# Patient Record
Sex: Female | Born: 1970 | Race: Black or African American | Hispanic: No | Smoking: Never smoker
Health system: Southern US, Community
[De-identification: ages and names within clinical notes are randomized; demographics above are authoritative.]

## PROBLEM LIST (undated history)

## (undated) DIAGNOSIS — K8020 Calculus of gallbladder without cholecystitis without obstruction: Secondary | ICD-10-CM

## (undated) DIAGNOSIS — D649 Anemia, unspecified: Secondary | ICD-10-CM

## (undated) HISTORY — DX: Anemia, unspecified: D64.9

## (undated) HISTORY — PX: URETHRAL DIVERTICULUM REPAIR: SHX5148

---

## 1995-01-14 DIAGNOSIS — E119 Type 2 diabetes mellitus without complications: Secondary | ICD-10-CM | POA: Insufficient documentation

## 2003-01-31 ENCOUNTER — Inpatient Hospital Stay (HOSPITAL_COMMUNITY): Admission: AD | Admit: 2003-01-31 | Discharge: 2003-01-31 | Payer: Self-pay | Admitting: *Deleted

## 2003-02-01 ENCOUNTER — Inpatient Hospital Stay (HOSPITAL_COMMUNITY): Admission: AD | Admit: 2003-02-01 | Discharge: 2003-02-01 | Payer: Self-pay | Admitting: Obstetrics and Gynecology

## 2003-02-14 ENCOUNTER — Encounter (INDEPENDENT_AMBULATORY_CARE_PROVIDER_SITE_OTHER): Payer: Self-pay | Admitting: *Deleted

## 2003-02-14 ENCOUNTER — Encounter: Admission: RE | Admit: 2003-02-14 | Discharge: 2003-02-14 | Payer: Self-pay | Admitting: Obstetrics and Gynecology

## 2003-02-27 ENCOUNTER — Emergency Department (HOSPITAL_COMMUNITY): Admission: EM | Admit: 2003-02-27 | Discharge: 2003-02-27 | Payer: Self-pay | Admitting: Family Medicine

## 2003-04-13 ENCOUNTER — Encounter: Admission: RE | Admit: 2003-04-13 | Discharge: 2003-04-13 | Payer: Self-pay | Admitting: Obstetrics and Gynecology

## 2003-04-13 ENCOUNTER — Inpatient Hospital Stay (HOSPITAL_COMMUNITY): Admission: AD | Admit: 2003-04-13 | Discharge: 2003-04-13 | Payer: Self-pay | Admitting: Obstetrics & Gynecology

## 2003-05-19 ENCOUNTER — Encounter: Admission: RE | Admit: 2003-05-19 | Discharge: 2003-05-19 | Payer: Self-pay | Admitting: Family Medicine

## 2003-08-03 ENCOUNTER — Other Ambulatory Visit: Admission: RE | Admit: 2003-08-03 | Discharge: 2003-08-03 | Payer: Self-pay | Admitting: Obstetrics and Gynecology

## 2003-08-03 ENCOUNTER — Encounter (INDEPENDENT_AMBULATORY_CARE_PROVIDER_SITE_OTHER): Payer: Self-pay | Admitting: *Deleted

## 2003-08-03 ENCOUNTER — Encounter: Admission: RE | Admit: 2003-08-03 | Discharge: 2003-08-03 | Payer: Self-pay | Admitting: Obstetrics and Gynecology

## 2003-08-18 ENCOUNTER — Encounter: Admission: RE | Admit: 2003-08-18 | Discharge: 2003-08-18 | Payer: Self-pay | Admitting: Obstetrics and Gynecology

## 2003-10-05 ENCOUNTER — Emergency Department (HOSPITAL_COMMUNITY): Admission: EM | Admit: 2003-10-05 | Discharge: 2003-10-05 | Payer: Self-pay | Admitting: *Deleted

## 2003-10-07 ENCOUNTER — Emergency Department (HOSPITAL_COMMUNITY): Admission: EM | Admit: 2003-10-07 | Discharge: 2003-10-07 | Payer: Self-pay | Admitting: Emergency Medicine

## 2003-11-13 ENCOUNTER — Emergency Department (HOSPITAL_COMMUNITY): Admission: EM | Admit: 2003-11-13 | Discharge: 2003-11-13 | Payer: Self-pay | Admitting: Family Medicine

## 2004-02-01 ENCOUNTER — Emergency Department (HOSPITAL_COMMUNITY): Admission: EM | Admit: 2004-02-01 | Discharge: 2004-02-01 | Payer: Self-pay | Admitting: Family Medicine

## 2004-03-31 ENCOUNTER — Emergency Department (HOSPITAL_COMMUNITY): Admission: EM | Admit: 2004-03-31 | Discharge: 2004-03-31 | Payer: Self-pay | Admitting: Emergency Medicine

## 2004-09-14 ENCOUNTER — Emergency Department (HOSPITAL_COMMUNITY): Admission: EM | Admit: 2004-09-14 | Discharge: 2004-09-14 | Payer: Self-pay | Admitting: *Deleted

## 2004-09-24 ENCOUNTER — Encounter: Admission: RE | Admit: 2004-09-24 | Discharge: 2004-09-24 | Payer: Self-pay | Admitting: Occupational Medicine

## 2004-10-10 ENCOUNTER — Encounter: Admission: RE | Admit: 2004-10-10 | Discharge: 2004-10-23 | Payer: Self-pay | Admitting: Occupational Medicine

## 2005-02-28 ENCOUNTER — Emergency Department (HOSPITAL_COMMUNITY): Admission: EM | Admit: 2005-02-28 | Discharge: 2005-02-28 | Payer: Self-pay | Admitting: Family Medicine

## 2005-07-01 ENCOUNTER — Emergency Department (HOSPITAL_COMMUNITY): Admission: EM | Admit: 2005-07-01 | Discharge: 2005-07-01 | Payer: Self-pay | Admitting: Family Medicine

## 2005-12-15 ENCOUNTER — Inpatient Hospital Stay (HOSPITAL_COMMUNITY): Admission: AD | Admit: 2005-12-15 | Discharge: 2005-12-15 | Payer: Self-pay | Admitting: Gynecology

## 2005-12-17 ENCOUNTER — Inpatient Hospital Stay (HOSPITAL_COMMUNITY): Admission: AD | Admit: 2005-12-17 | Discharge: 2005-12-17 | Payer: Self-pay | Admitting: Gynecology

## 2005-12-24 ENCOUNTER — Inpatient Hospital Stay (HOSPITAL_COMMUNITY): Admission: RE | Admit: 2005-12-24 | Discharge: 2005-12-24 | Payer: Self-pay | Admitting: Obstetrics & Gynecology

## 2006-07-20 ENCOUNTER — Inpatient Hospital Stay (HOSPITAL_COMMUNITY): Admission: AD | Admit: 2006-07-20 | Discharge: 2006-07-20 | Payer: Self-pay | Admitting: Obstetrics

## 2006-10-14 ENCOUNTER — Emergency Department (HOSPITAL_COMMUNITY): Admission: EM | Admit: 2006-10-14 | Discharge: 2006-10-14 | Payer: Self-pay | Admitting: Family Medicine

## 2007-03-03 ENCOUNTER — Emergency Department (HOSPITAL_COMMUNITY): Admission: EM | Admit: 2007-03-03 | Discharge: 2007-03-03 | Payer: Self-pay | Admitting: Emergency Medicine

## 2007-03-12 ENCOUNTER — Emergency Department (HOSPITAL_COMMUNITY): Admission: EM | Admit: 2007-03-12 | Discharge: 2007-03-12 | Payer: Self-pay | Admitting: Family Medicine

## 2007-05-01 ENCOUNTER — Inpatient Hospital Stay (HOSPITAL_COMMUNITY): Admission: AD | Admit: 2007-05-01 | Discharge: 2007-05-01 | Payer: Self-pay | Admitting: Obstetrics & Gynecology

## 2007-05-13 ENCOUNTER — Emergency Department (HOSPITAL_COMMUNITY): Admission: EM | Admit: 2007-05-13 | Discharge: 2007-05-13 | Payer: Self-pay | Admitting: Family Medicine

## 2007-05-13 LAB — CONVERTED CEMR LAB
BUN: 8 mg/dL
Chloride: 104 meq/L
Creatinine, Ser: 0.8 mg/dL
Glucose, Bld: 113 mg/dL
HCT: 40 %
Hemoglobin: 13.6 g/dL
Potassium: 3.8 meq/L
Sodium: 140 meq/L

## 2007-05-14 LAB — CONVERTED CEMR LAB: Pap Smear: NEGATIVE

## 2007-05-19 ENCOUNTER — Ambulatory Visit: Payer: Self-pay | Admitting: Obstetrics and Gynecology

## 2007-05-19 ENCOUNTER — Encounter: Payer: Self-pay | Admitting: Obstetrics and Gynecology

## 2007-05-20 ENCOUNTER — Ambulatory Visit (HOSPITAL_COMMUNITY): Admission: RE | Admit: 2007-05-20 | Discharge: 2007-05-20 | Payer: Self-pay | Admitting: Obstetrics and Gynecology

## 2007-06-02 ENCOUNTER — Ambulatory Visit: Payer: Self-pay | Admitting: Obstetrics & Gynecology

## 2007-07-05 ENCOUNTER — Ambulatory Visit: Payer: Self-pay | Admitting: Nurse Practitioner

## 2007-07-05 DIAGNOSIS — R635 Abnormal weight gain: Secondary | ICD-10-CM | POA: Insufficient documentation

## 2007-07-05 LAB — CONVERTED CEMR LAB
Bilirubin Urine: NEGATIVE
Blood Glucose, Fingerstick: 161
Glucose, Urine, Semiquant: NEGATIVE
Hgb A1c MFr Bld: 6.8 %
Nitrite: NEGATIVE
Protein, U semiquant: 30
Specific Gravity, Urine: 1.015
Urobilinogen, UA: 1
pH: 6.5

## 2007-07-07 ENCOUNTER — Ambulatory Visit: Payer: Self-pay | Admitting: *Deleted

## 2007-08-13 ENCOUNTER — Ambulatory Visit: Payer: Self-pay | Admitting: Nurse Practitioner

## 2007-08-13 DIAGNOSIS — R05 Cough: Secondary | ICD-10-CM

## 2007-08-13 DIAGNOSIS — R059 Cough, unspecified: Secondary | ICD-10-CM | POA: Insufficient documentation

## 2007-08-13 DIAGNOSIS — E669 Obesity, unspecified: Secondary | ICD-10-CM | POA: Insufficient documentation

## 2007-08-13 LAB — CONVERTED CEMR LAB
Bilirubin Urine: NEGATIVE
Blood Glucose, Fingerstick: 125
Blood in Urine, dipstick: NEGATIVE
Glucose, Urine, Semiquant: NEGATIVE
Ketones, urine, test strip: NEGATIVE
Nitrite: NEGATIVE
Protein, U semiquant: 30
Specific Gravity, Urine: 1.015
Urobilinogen, UA: 0.2
pH: 8

## 2007-08-14 ENCOUNTER — Encounter (INDEPENDENT_AMBULATORY_CARE_PROVIDER_SITE_OTHER): Payer: Self-pay | Admitting: Nurse Practitioner

## 2007-08-16 ENCOUNTER — Encounter (INDEPENDENT_AMBULATORY_CARE_PROVIDER_SITE_OTHER): Payer: Self-pay | Admitting: Nurse Practitioner

## 2007-08-16 LAB — CONVERTED CEMR LAB
ALT: 23 units/L (ref 0–35)
AST: 13 units/L (ref 0–37)
Albumin: 4.1 g/dL (ref 3.5–5.2)
Alkaline Phosphatase: 52 units/L (ref 39–117)
BUN: 13 mg/dL (ref 6–23)
Basophils Absolute: 0 10*3/uL (ref 0.0–0.1)
Basophils Relative: 0 % (ref 0–1)
CO2: 25 meq/L (ref 19–32)
Calcium: 9.1 mg/dL (ref 8.4–10.5)
Chloride: 104 meq/L (ref 96–112)
Cholesterol: 195 mg/dL (ref 0–200)
Creatinine, Ser: 0.66 mg/dL (ref 0.40–1.20)
Eosinophils Absolute: 0.1 10*3/uL (ref 0.0–0.7)
Eosinophils Relative: 1 % (ref 0–5)
Glucose, Bld: 126 mg/dL — ABNORMAL HIGH (ref 70–99)
HCT: 34.5 % — ABNORMAL LOW (ref 36.0–46.0)
HDL: 46 mg/dL (ref >39)
Hemoglobin: 10.5 g/dL — ABNORMAL LOW (ref 12.0–15.0)
LDL Cholesterol: 127 mg/dL — ABNORMAL HIGH (ref 0–99)
Lymphocytes Relative: 36 % (ref 12–46)
Lymphs Abs: 2 10*3/uL (ref 0.7–4.0)
MCHC: 30.4 g/dL (ref 30.0–36.0)
MCV: 80.4 fL (ref 78.0–100.0)
Microalb, Ur: 0.75 mg/dL (ref 0.00–1.89)
Monocytes Absolute: 0.2 10*3/uL (ref 0.1–1.0)
Monocytes Relative: 4 % (ref 3–12)
Neutro Abs: 3.3 10*3/uL (ref 1.7–7.7)
Neutrophils Relative %: 58 % (ref 43–77)
Platelets: 283 10*3/uL (ref 150–400)
Potassium: 4.2 meq/L (ref 3.5–5.3)
RBC: 4.29 M/uL (ref 3.87–5.11)
RDW: 16.9 % — ABNORMAL HIGH (ref 11.5–15.5)
Sodium: 138 meq/L (ref 135–145)
TSH: 0.733 microintl units/mL (ref 0.350–4.50)
Total Bilirubin: 0.7 mg/dL (ref 0.3–1.2)
Total CHOL/HDL Ratio: 4.2
Total Protein: 7.5 g/dL (ref 6.0–8.3)
Triglycerides: 108 mg/dL (ref <150)
VLDL: 22 mg/dL (ref 0–40)
WBC: 5.6 10*3/uL (ref 4.0–10.5)

## 2007-09-13 ENCOUNTER — Emergency Department (HOSPITAL_COMMUNITY): Admission: EM | Admit: 2007-09-13 | Discharge: 2007-09-13 | Payer: Self-pay | Admitting: Emergency Medicine

## 2007-11-16 ENCOUNTER — Telehealth (INDEPENDENT_AMBULATORY_CARE_PROVIDER_SITE_OTHER): Payer: Self-pay | Admitting: Nurse Practitioner

## 2007-11-17 ENCOUNTER — Ambulatory Visit: Payer: Self-pay | Admitting: Family Medicine

## 2007-11-17 LAB — CONVERTED CEMR LAB
Beta hcg, urine, semiquantitative: POSITIVE
Bilirubin Urine: NEGATIVE
Blood Glucose, Fingerstick: 138
Blood in Urine, dipstick: NEGATIVE
Glucose, Urine, Semiquant: NEGATIVE
Hgb A1c MFr Bld: 6.7 %
Ketones, urine, test strip: NEGATIVE
Nitrite: NEGATIVE
Protein, U semiquant: NEGATIVE
Specific Gravity, Urine: 1.015
Urobilinogen, UA: 1
pH: 7

## 2007-11-18 ENCOUNTER — Telehealth (INDEPENDENT_AMBULATORY_CARE_PROVIDER_SITE_OTHER): Payer: Self-pay | Admitting: Nurse Practitioner

## 2008-07-18 ENCOUNTER — Emergency Department (HOSPITAL_COMMUNITY): Admission: EM | Admit: 2008-07-18 | Discharge: 2008-07-18 | Payer: Self-pay | Admitting: Family Medicine

## 2008-07-27 ENCOUNTER — Emergency Department (HOSPITAL_COMMUNITY): Admission: EM | Admit: 2008-07-27 | Discharge: 2008-07-28 | Payer: Self-pay | Admitting: Emergency Medicine

## 2008-09-18 ENCOUNTER — Inpatient Hospital Stay (HOSPITAL_COMMUNITY): Admission: AD | Admit: 2008-09-18 | Discharge: 2008-09-19 | Payer: Self-pay | Admitting: Obstetrics

## 2008-11-27 ENCOUNTER — Emergency Department (HOSPITAL_COMMUNITY): Admission: EM | Admit: 2008-11-27 | Discharge: 2008-11-27 | Payer: Self-pay | Admitting: Emergency Medicine

## 2009-04-01 ENCOUNTER — Ambulatory Visit: Payer: Self-pay | Admitting: Obstetrics and Gynecology

## 2009-04-01 ENCOUNTER — Inpatient Hospital Stay (HOSPITAL_COMMUNITY): Admission: AD | Admit: 2009-04-01 | Discharge: 2009-04-01 | Payer: Self-pay | Admitting: Family Medicine

## 2009-04-13 ENCOUNTER — Emergency Department (HOSPITAL_COMMUNITY): Admission: EM | Admit: 2009-04-13 | Discharge: 2009-04-13 | Payer: Self-pay | Admitting: Emergency Medicine

## 2009-09-16 ENCOUNTER — Emergency Department (HOSPITAL_COMMUNITY): Admission: EM | Admit: 2009-09-16 | Discharge: 2009-09-16 | Payer: Self-pay | Admitting: Emergency Medicine

## 2010-02-02 ENCOUNTER — Encounter: Payer: Self-pay | Admitting: *Deleted

## 2010-03-28 LAB — CBC
HCT: 32.5 % — ABNORMAL LOW (ref 36.0–46.0)
Hemoglobin: 10.8 g/dL — ABNORMAL LOW (ref 12.0–15.0)
MCH: 26.6 pg (ref 26.0–34.0)
MCHC: 33.3 g/dL (ref 30.0–36.0)
MCV: 79.9 fL (ref 78.0–100.0)
Platelets: 223 10*3/uL (ref 150–400)
RBC: 4.07 MIL/uL (ref 3.87–5.11)
RDW: 15.9 % — ABNORMAL HIGH (ref 11.5–15.5)
WBC: 8.4 10*3/uL (ref 4.0–10.5)

## 2010-03-28 LAB — DIFFERENTIAL
Basophils Absolute: 0 10*3/uL (ref 0.0–0.1)
Basophils Relative: 0 % (ref 0–1)
Eosinophils Absolute: 0.1 10*3/uL (ref 0.0–0.7)
Eosinophils Relative: 1 % (ref 0–5)
Lymphocytes Relative: 39 % (ref 12–46)
Lymphs Abs: 3.3 10*3/uL (ref 0.7–4.0)
Monocytes Absolute: 0.4 10*3/uL (ref 0.1–1.0)
Monocytes Relative: 5 % (ref 3–12)
Neutro Abs: 4.6 10*3/uL (ref 1.7–7.7)
Neutrophils Relative %: 54 % (ref 43–77)

## 2010-03-28 LAB — URINALYSIS, ROUTINE W REFLEX MICROSCOPIC
Bilirubin Urine: NEGATIVE
Hgb urine dipstick: NEGATIVE
Ketones, ur: NEGATIVE mg/dL
Nitrite: NEGATIVE
Protein, ur: NEGATIVE mg/dL
Specific Gravity, Urine: 1.01 (ref 1.005–1.030)
Urobilinogen, UA: 1 mg/dL (ref 0.0–1.0)

## 2010-03-28 LAB — COMPREHENSIVE METABOLIC PANEL
ALT: 24 U/L (ref 0–35)
AST: 19 U/L (ref 0–37)
Albumin: 3.6 g/dL (ref 3.5–5.2)
Alkaline Phosphatase: 43 U/L (ref 39–117)
BUN: 12 mg/dL (ref 6–23)
CO2: 28 mEq/L (ref 19–32)
Calcium: 8.9 mg/dL (ref 8.4–10.5)
Chloride: 106 mEq/L (ref 96–112)
Creatinine, Ser: 0.69 mg/dL (ref 0.4–1.2)
GFR calc Af Amer: >60 mL/min (ref >60)
GFR calc non Af Amer: >60 mL/min (ref >60)
Glucose, Bld: 106 mg/dL — ABNORMAL HIGH (ref 70–99)
Potassium: 3.5 mEq/L (ref 3.5–5.1)
Sodium: 137 mEq/L (ref 135–145)
Total Bilirubin: 0.5 mg/dL (ref 0.3–1.2)
Total Protein: 7.4 g/dL (ref 6.0–8.3)

## 2010-03-28 LAB — URINE CULTURE: Culture  Setup Time: 201109042355

## 2010-03-28 LAB — URINE MICROSCOPIC-ADD ON

## 2010-03-28 LAB — POCT PREGNANCY, URINE: Preg Test, Ur: NEGATIVE

## 2010-04-03 LAB — BASIC METABOLIC PANEL
BUN: 7 mg/dL (ref 6–23)
CO2: 27 mEq/L (ref 19–32)
Calcium: 9.3 mg/dL (ref 8.4–10.5)
Glucose, Bld: 107 mg/dL — ABNORMAL HIGH (ref 70–99)
Sodium: 138 mEq/L (ref 135–145)

## 2010-04-03 LAB — CBC
HCT: 35.4 % — ABNORMAL LOW (ref 36.0–46.0)
Hemoglobin: 11.4 g/dL — ABNORMAL LOW (ref 12.0–15.0)
MCHC: 32.2 g/dL (ref 30.0–36.0)
Platelets: 262 10*3/uL (ref 150–400)
RDW: 15.2 % (ref 11.5–15.5)

## 2010-04-03 LAB — URINE MICROSCOPIC-ADD ON

## 2010-04-03 LAB — URINALYSIS, ROUTINE W REFLEX MICROSCOPIC
Bilirubin Urine: NEGATIVE
Glucose, UA: NEGATIVE mg/dL
Ketones, ur: NEGATIVE mg/dL
pH: 6.5 (ref 5.0–8.0)

## 2010-04-03 LAB — DIFFERENTIAL
Eosinophils Absolute: 0.1 10*3/uL (ref 0.0–0.7)
Eosinophils Relative: 1 % (ref 0–5)
Lymphocytes Relative: 46 % (ref 12–46)
Lymphs Abs: 2.8 10*3/uL (ref 0.7–4.0)
Monocytes Absolute: 0.3 10*3/uL (ref 0.1–1.0)

## 2010-04-03 LAB — CK TOTAL AND CKMB (NOT AT ARMC): Total CK: 93 U/L (ref 7–177)

## 2010-04-03 LAB — POCT PREGNANCY, URINE: Preg Test, Ur: NEGATIVE

## 2010-04-03 LAB — URINE CULTURE

## 2010-04-07 LAB — URINALYSIS, ROUTINE W REFLEX MICROSCOPIC
Bilirubin Urine: NEGATIVE
Glucose, UA: NEGATIVE mg/dL
Ketones, ur: NEGATIVE mg/dL
pH: 6 (ref 5.0–8.0)

## 2010-04-07 LAB — URINE CULTURE

## 2010-04-07 LAB — URINE MICROSCOPIC-ADD ON

## 2010-04-17 LAB — DIFFERENTIAL
Eosinophils Absolute: 0.1 10*3/uL (ref 0.0–0.7)
Lymphocytes Relative: 33 % (ref 12–46)
Lymphs Abs: 2.6 10*3/uL (ref 0.7–4.0)
Neutrophils Relative %: 60 % (ref 43–77)

## 2010-04-17 LAB — URINALYSIS, ROUTINE W REFLEX MICROSCOPIC
Ketones, ur: NEGATIVE mg/dL
Nitrite: NEGATIVE
Protein, ur: NEGATIVE mg/dL
pH: 6.5 (ref 5.0–8.0)

## 2010-04-17 LAB — POCT I-STAT, CHEM 8
BUN: 16 mg/dL (ref 6–23)
Chloride: 98 mEq/L (ref 96–112)
HCT: 40 % (ref 36.0–46.0)
Sodium: 136 mEq/L (ref 135–145)
TCO2: 27 mmol/L (ref 0–100)

## 2010-04-17 LAB — CBC
Hemoglobin: 12.1 g/dL (ref 12.0–15.0)
MCHC: 33.5 g/dL (ref 30.0–36.0)
MCV: 80.3 fL (ref 78.0–100.0)
RBC: 4.48 MIL/uL (ref 3.87–5.11)

## 2010-04-17 LAB — URINE CULTURE

## 2010-04-17 LAB — GLUCOSE, CAPILLARY: Glucose-Capillary: 162 mg/dL — ABNORMAL HIGH (ref 70–99)

## 2010-04-17 LAB — COMPREHENSIVE METABOLIC PANEL
BUN: 16 mg/dL (ref 6–23)
CO2: 27 mEq/L (ref 19–32)
Calcium: 8.9 mg/dL (ref 8.4–10.5)
Creatinine, Ser: 0.79 mg/dL (ref 0.4–1.2)
GFR calc non Af Amer: >60 mL/min (ref >60)
Glucose, Bld: 175 mg/dL — ABNORMAL HIGH (ref 70–99)

## 2010-04-17 LAB — LIPASE, BLOOD: Lipase: 47 U/L (ref 11–59)

## 2010-04-17 LAB — URINE MICROSCOPIC-ADD ON

## 2010-04-17 IMAGING — US US TRANSVAGINAL NON-OB
1 series · 13 of 25 positions shown · non-contrast
Comparison: None

CLINICAL DATA: Abnormal periods.  LMP 05/13/2007

TRANSABDOMINAL AND TRANSVAGINAL ULTRASOUND OF PELVIS
TECHNIQUE: Both transabdominal and transvaginal ultrasound
examinations of the pelvis were performed including evaluation of
the uterus, ovaries, adnexal regions, and pelvic cul-de-sac.

[Series 1: us transvaginal non-ob · 13 of 41 slices shown]
[im 1/41]
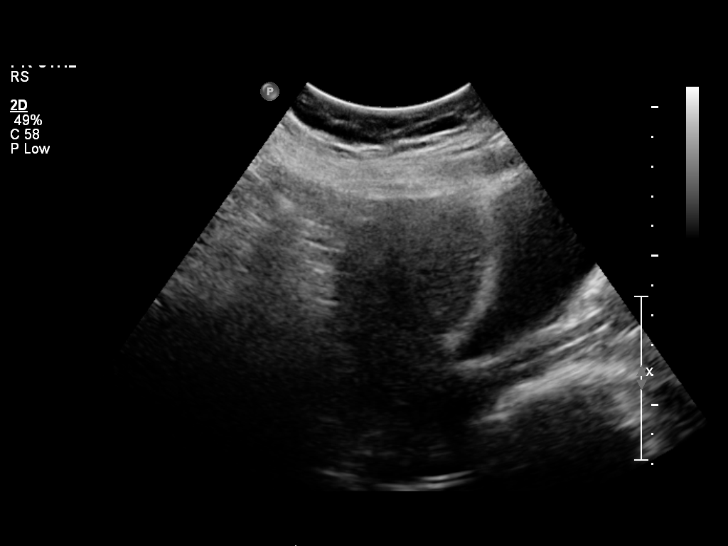
[im 4/41]
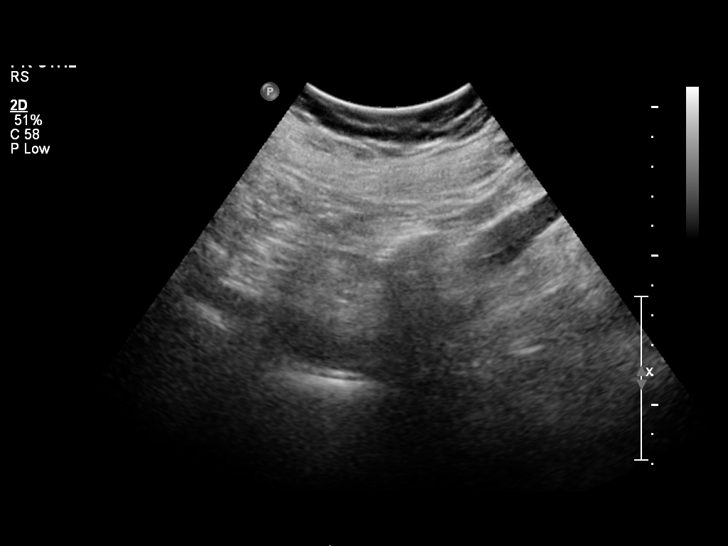
[im 7/41]
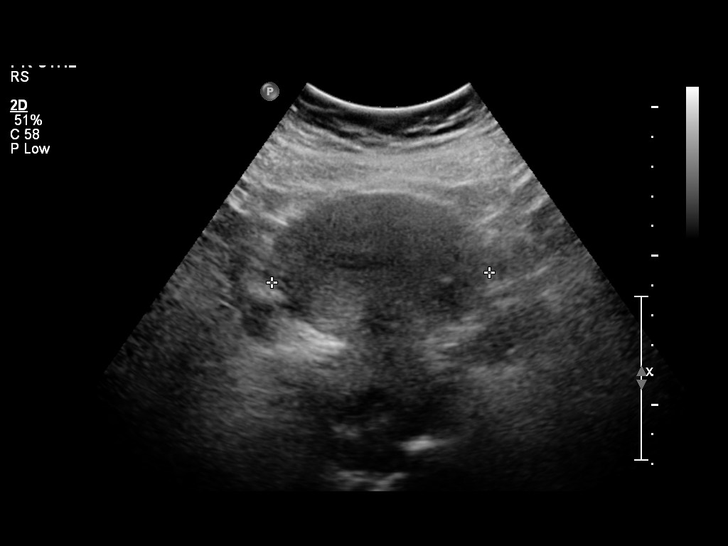
[im 11/41]
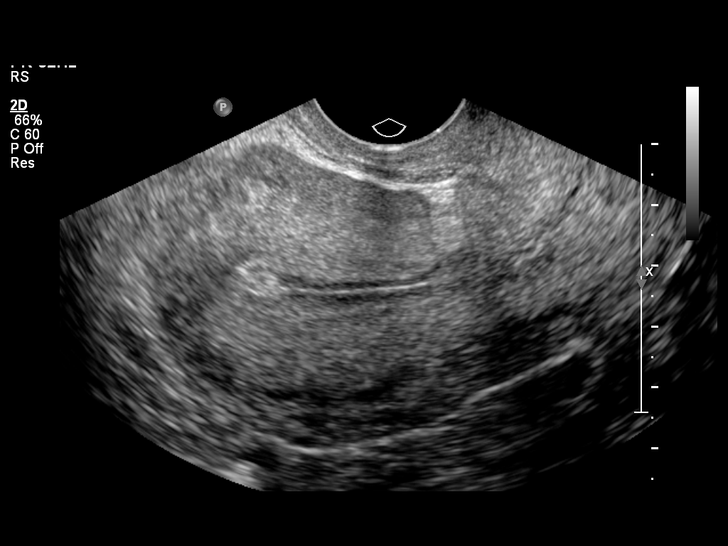
[im 14/41]
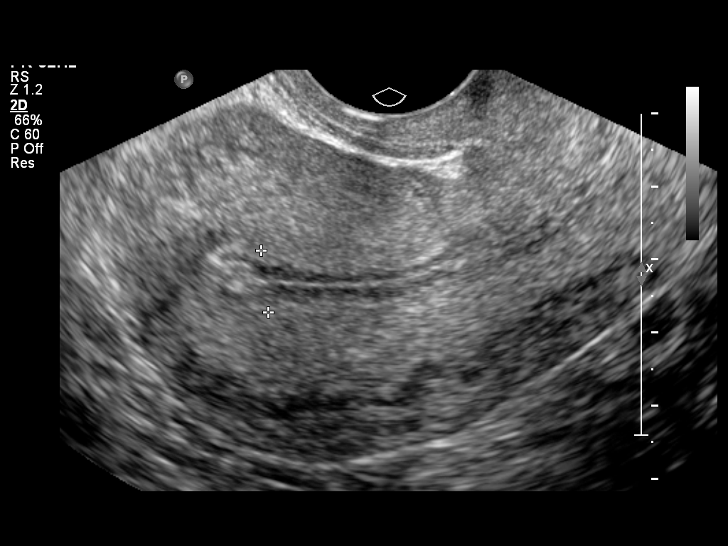
[im 17/41]
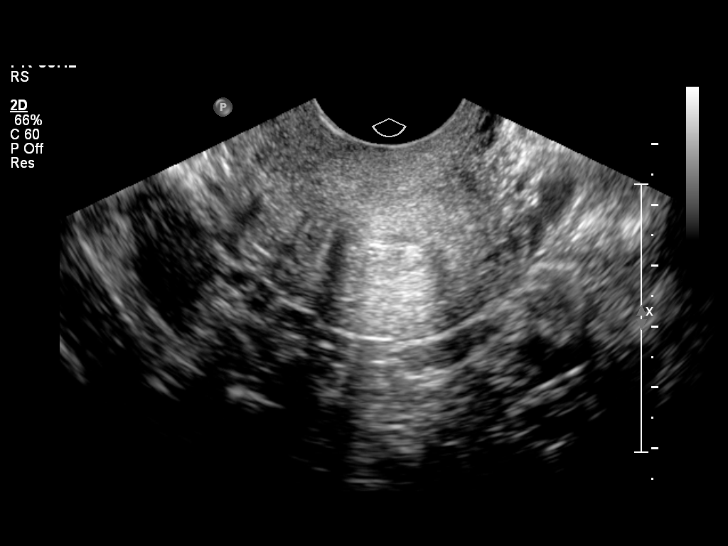
[im 21/41]
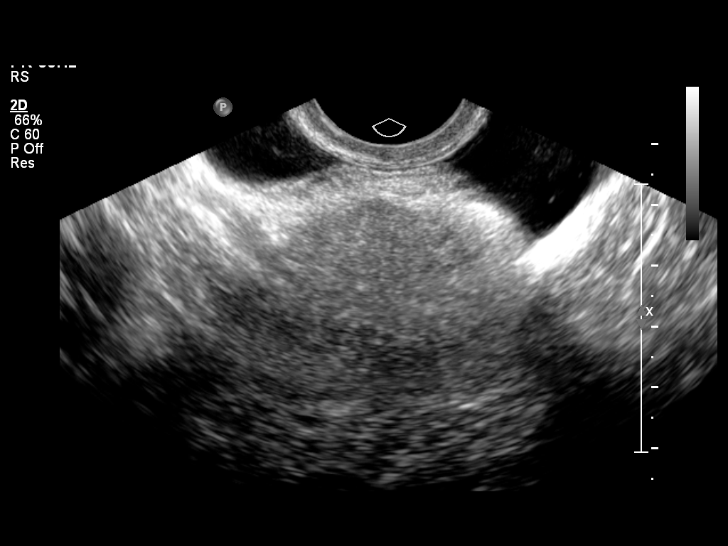
[im 24/41]
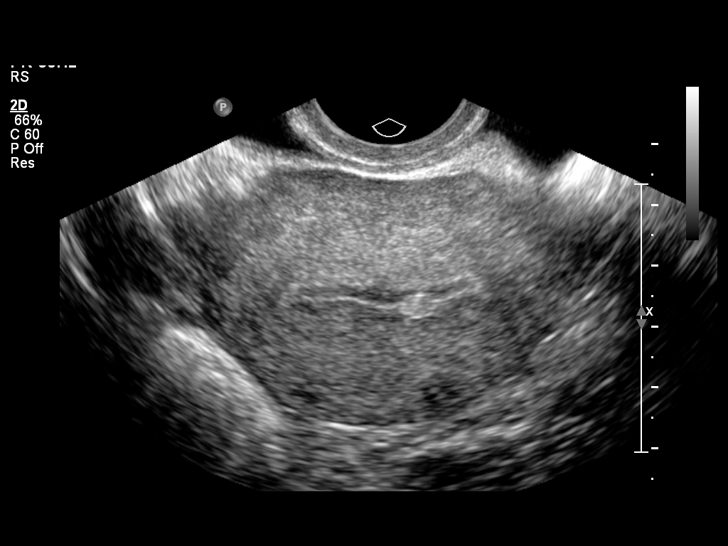
[im 27/41]
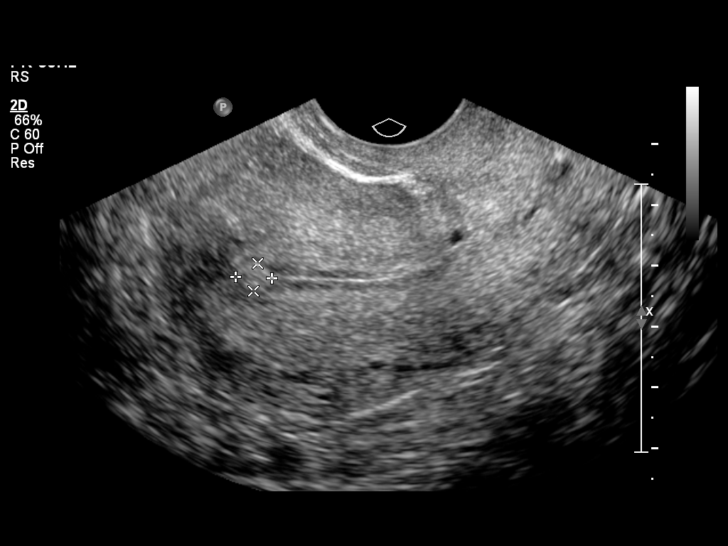
[im 31/41]
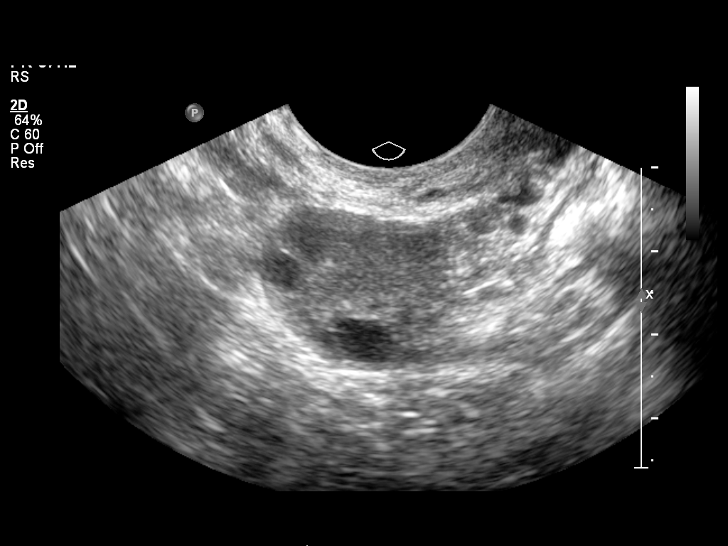
[im 34/41]
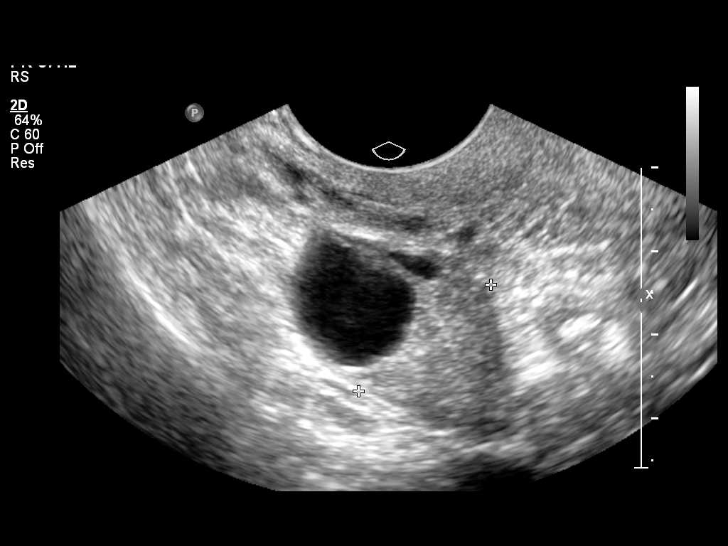
[im 37/41]
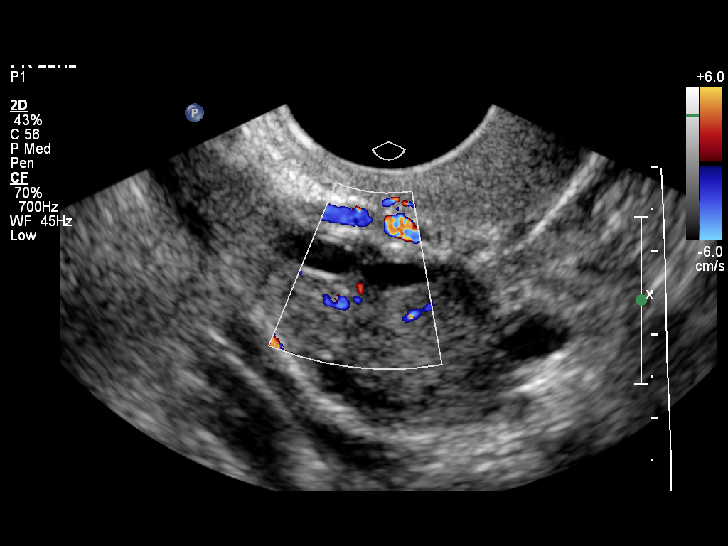
[im 41/41]
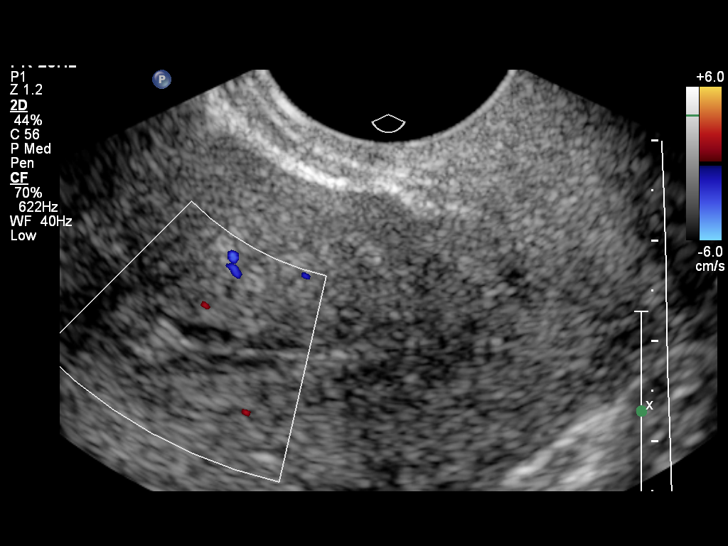

[13 of 25 positions shown; findings below may reference images not displayed]

FINDINGS: The uterus demonstrates a sagittal length of 8.5 cm, an
AP width of 4.6 cm and a transverse width of 5.9 cm.  A homogeneous
uterine myometrium is seen.  The endometrial canal demonstrates an
AP width of 8.5 mm and a tri- layered appearance compatible with a
periovulatory endometrial stripe.  This would correlate with the
patient's given LMP of 05/13/2007.  There is an area of focal
echogenicity identified in the fundal portion of the endometrial
canal which measures 6.8 x 5.0 by 6.4 mm.  No focal flow is seen to
this location with color Doppler evaluation , however the
appearance is suggestive of a small focal polyp.  Further
evaluation with sonohysterography would be recommended for
confirmation.

Both ovaries have a normal appearance with the left ovary measuring
2.3 x 1.8 by 1.8 cm and the right ovary measuring 2.0 x 3.5 x
cm. A dominant follicle is identified on the right.

No pelvic fluid is seen.
IMPRESSION: Small focal area of echogenicity within the fundal portion of the
endometrial canal worrisome for a focal polyp.  Further evaluation
with sonohysterography is recommended for complete evaluation.
Otherwise normal periovulatory pelvic ultrasound.

## 2010-04-19 LAB — WET PREP, GENITAL: Trich, Wet Prep: NONE SEEN

## 2010-04-19 LAB — URINALYSIS, ROUTINE W REFLEX MICROSCOPIC
Ketones, ur: NEGATIVE mg/dL
Protein, ur: NEGATIVE mg/dL
Urobilinogen, UA: 0.2 mg/dL (ref 0.0–1.0)

## 2010-04-19 LAB — URINE MICROSCOPIC-ADD ON

## 2010-04-19 LAB — POCT PREGNANCY, URINE: Preg Test, Ur: NEGATIVE

## 2010-04-21 LAB — POCT PREGNANCY, URINE: Preg Test, Ur: POSITIVE

## 2010-05-28 NOTE — Group Therapy Note (Signed)
NAMEMORRIS, MARKHAM NO.:  [id number]   MEDICAL RECORD NO.:  [id number]          PATIENT TYPE:  WOC   LOCATION:  [facility]                   FACILITY:  [facility]   PHYSICIAN:  Elsie Lincoln, MD      DATE OF BIRTH:  12-20-1970   DATE OF SERVICE:  06/02/2007                                  CLINIC NOTE   Patient is a 40 year old female who presents for results from her labs  and ultrasound.  Patient had some irregular spotting in April, and had  tender breasts, and darkening of the linea nigra.  She was also worried  that she is pregnant.  However, she does use condoms for contraception.  On May 7, her beta HCT was negative.  Her prolactin was within normal  limits.  Her transvaginal ultrasound showed a normal uterus and ovaries  except for an area of focal echogenicity  in the fundal portion, which  is 6.8 x 5.0 x 6.4.  May be a small focal polyp.  Tried to explain this  to the patient and she refused surgery at this time, but said the  bleeding is not mostly the issue.  She is mostly worried about being  tired and her breasts hurting.  We talked about evening primrose oil  for the breasts.  We are going to check a TSH and a CBC because she does  have a history of anemia.  I suggested she go to her primary care  physician to make sure her diabetes is well controlled.  I will call her  with the results of the CBC and the TSH.  The patient can come back  p.r.n.  If the patient skips her period, the patient is supposed to get  a pregnancy test.  Also of note, her Pap smear was normal in May.           ______________________________  Elsie Lincoln, MD     KL/MEDQ  D:  06/02/2007  T:  06/02/2007  Job:  [id number]

## 2010-05-28 NOTE — Group Therapy Note (Signed)
NAME:  Belinda Stewart, Belinda Stewart NO.:  [id number]   MEDICAL RECORD NO.:  [id number]          PATIENT TYPE:  WOC   LOCATION:  [facility]                   FACILITY:  [facility]   PHYSICIAN:  Argentina Donovan, MD        DATE OF BIRTH:  1970-08-17   DATE OF SERVICE:  05/19/2007                                  CLINIC NOTE   HISTORY OF PRESENT ILLNESS:  The patient is a 40 year old African  American female gravida 4, para 3-0-1-3 who had a miscarriage late last  year.  She had normal periods up until March of this year.  She then had  a period April 2 that lasted only 2 days, one April 30 that lasted 2  days, then had 2 days of post period menstrual spotting.  For the past  month and a half, she has had very tender breasts, darkening of her  nipples, darkening linea nigra and no lactorrhea on breast compression.  She went into the maternity admissions office a short-time ago,  pregnancy test which was negative.  She said she had one at home that  she did that had the partial line.  She feels pregnant.  She has the  headaches.  She says all this goes with the pregnancy as well as a  darkening line on her abdomen and her nipples and the tender breasts.  She had a history of an abnormal Pap smear, but has not had a Pap again  in several years.  We plan on doing that today.   PHYSICAL EXAMINATION:  BREASTS:  Symmetrical with no dominant masses.  Slight nipple discharge on compression.  ABDOMEN:  Soft, flat, nontender.  No masses or organomegaly, but a  marked pronounced linea nigra.  External genitalia is normal.  BUS  within normal limits.  Vagina is clean and well rugated.  Cervix is  clean and parous and Pap smear was taken.  Uterus with first degree  retroversion, does not appear to be enlarged.  The adnexa could not be  well outlined because of the habitus of the patient, 422 pounds.   IMPRESSION:  Abnormal periods and symptoms of pregnancy per the patient.  She says she even has some  nausea.   PLAN:  Quantitative data, prolactin and a pelvic ultrasound.  Rule out  any other hormonal abnormality that may be due to ovarian cyst, etc.           ______________________________  Argentina Donovan, MD     PR/MEDQ  D:  05/19/2007  T:  05/19/2007  Job:  [id number]

## 2010-05-31 NOTE — Group Therapy Note (Signed)
Belinda Stewart, Belinda Stewart                          ACCOUNT NO.:  [id number]   MEDICAL RECORD NO.:  [id number]                   PATIENT TYPE:  OUT   LOCATION:  [facility]                           FACILITY:  [facility]   PHYSICIAN:  Argentina Donovan, MD                     DATE OF BIRTH:  08/27/1970   DATE OF SERVICE:  02/14/2003                                    CLINIC NOTE   REASON FOR VISIT:  The patient is a 40 year old gravida 3 para 3-0-0-3 who  had her first baby vaginal delivery, the second one was a C-section because  of placenta previa, and the third weighed 12 pounds and was a C-section.  She is in good health with the exception of type 2 diabetes for which she  takes 10 mg of Avandia each night and that has been going on since her last  pregnancy.  She went into the [facility]U because of low abdominal pain 2 weeks ago  and was treated for an early pyelonephritis.  Those symptoms have gone but  in discussing she has had long-time dysmenorrhea.  Over the past year or two  it has gotten increasingly worse with increasingly heavy periods and  terrible pelvic pressure which is difficult as she is on her feet all day at  work.  The patient is 5 feet 9 inches, weighs 220 pounds, has a blood  pressure of 126/82 and pulse of 87. HEENT within normal limits.  The vagina  is clean and well rugated.  The external genitalia has two herpes lesions on  the left labia.  The patient gets about two episodes a year.  The cervix is  high in the vagina, parous, and clean.  The uterus is upper limits of normal  size, shape, and consistency is normal, and adnexa could not be well  palpated because of habitus of the patient.   IMPRESSION:  1. Dysmenorrhea, menorrhagia, probable adenomyosis.  Plan:  Pelvic sonogram,     CBC.  If the sonogram is okay we have talked to the patient.  She is not     anxious to get a hysterectomy so we will try an endometrial ablation as     our method of first trial.  I will give the  patient a prescription for     Valtrex.  2. Type 2 diabetes, on treatment.  3. Recurrent herpes simplex.                                               Argentina Donovan, MD    PR/MEDQ  D:  02/14/2003  T:  02/14/2003  Job:  [id number]

## 2010-07-26 ENCOUNTER — Emergency Department (HOSPITAL_COMMUNITY)
Admission: EM | Admit: 2010-07-26 | Discharge: 2010-07-27 | Discharge status: Against Medical Advice | Payer: Self-pay | Attending: Emergency Medicine | Admitting: Emergency Medicine

## 2010-07-26 ENCOUNTER — Inpatient Hospital Stay (INDEPENDENT_AMBULATORY_CARE_PROVIDER_SITE_OTHER)
Admission: RE | Admit: 2010-07-26 | Discharge: 2010-07-26 | Disposition: A | Discharge status: Home / Self Care | Payer: Self-pay | Source: Ambulatory Visit | Attending: Emergency Medicine | Admitting: Emergency Medicine

## 2010-07-26 DIAGNOSIS — K8020 Calculus of gallbladder without cholecystitis without obstruction: Secondary | ICD-10-CM | POA: Insufficient documentation

## 2010-07-26 DIAGNOSIS — Z79899 Other long term (current) drug therapy: Secondary | ICD-10-CM | POA: Insufficient documentation

## 2010-07-26 DIAGNOSIS — R1011 Right upper quadrant pain: Secondary | ICD-10-CM | POA: Insufficient documentation

## 2010-07-26 DIAGNOSIS — R10811 Right upper quadrant abdominal tenderness: Secondary | ICD-10-CM | POA: Insufficient documentation

## 2010-07-26 DIAGNOSIS — E119 Type 2 diabetes mellitus without complications: Secondary | ICD-10-CM | POA: Insufficient documentation

## 2010-07-26 DIAGNOSIS — R112 Nausea with vomiting, unspecified: Secondary | ICD-10-CM | POA: Insufficient documentation

## 2010-07-26 DIAGNOSIS — Z9889 Other specified postprocedural states: Secondary | ICD-10-CM | POA: Insufficient documentation

## 2010-07-26 LAB — DIFFERENTIAL
Basophils Absolute: 0 10*3/uL (ref 0.0–0.1)
Eosinophils Absolute: 0.1 10*3/uL (ref 0.0–0.7)
Eosinophils Relative: 1 % (ref 0–5)
Lymphocytes Relative: 44 % (ref 12–46)
Lymphs Abs: 4 10*3/uL (ref 0.7–4.0)
Neutrophils Relative %: 51 % (ref 43–77)

## 2010-07-26 LAB — POCT I-STAT, CHEM 8
BUN: 14 mg/dL (ref 6–23)
Calcium, Ion: 1.25 mmol/L (ref 1.12–1.32)
Chloride: 103 mEq/L (ref 96–112)
Creatinine, Ser: 0.7 mg/dL (ref 0.50–1.10)
Glucose, Bld: 105 mg/dL — ABNORMAL HIGH (ref 70–99)
TCO2: 25 mmol/L (ref 0–100)

## 2010-07-26 LAB — COMPREHENSIVE METABOLIC PANEL
BUN: 14 mg/dL (ref 6–23)
CO2: 26 mEq/L (ref 19–32)
Calcium: 9.7 mg/dL (ref 8.4–10.5)
Creatinine, Ser: 0.57 mg/dL (ref 0.50–1.10)
GFR calc Af Amer: >60 mL/min (ref >60)
GFR calc non Af Amer: >60 mL/min (ref >60)
Glucose, Bld: 103 mg/dL — ABNORMAL HIGH (ref 70–99)
Total Protein: 7.8 g/dL (ref 6.0–8.3)

## 2010-07-26 LAB — CBC
HCT: 34.3 % — ABNORMAL LOW (ref 36.0–46.0)
MCV: 77.6 fL — ABNORMAL LOW (ref 78.0–100.0)
Platelets: 243 10*3/uL (ref 150–400)
RBC: 4.42 MIL/uL (ref 3.87–5.11)
RDW: 15.3 % (ref 11.5–15.5)
WBC: 9.1 10*3/uL (ref 4.0–10.5)

## 2010-07-26 LAB — POCT URINALYSIS DIP (DEVICE)
Protein, ur: NEGATIVE mg/dL
Specific Gravity, Urine: 1.015 (ref 1.005–1.030)
Urobilinogen, UA: 0.2 mg/dL (ref 0.0–1.0)
pH: 5.5 (ref 5.0–8.0)

## 2010-07-26 LAB — POCT PREGNANCY, URINE: Preg Test, Ur: NEGATIVE

## 2010-07-27 ENCOUNTER — Emergency Department (HOSPITAL_COMMUNITY): Payer: Self-pay

## 2010-07-27 LAB — URINE CULTURE
Colony Count: 55000
Culture  Setup Time: 201207132038

## 2010-10-04 LAB — DIFFERENTIAL
Basophils Absolute: 0
Eosinophils Absolute: 0.1
Lymphs Abs: 2.7
Monocytes Absolute: 0.4
Neutrophils Relative %: 56

## 2010-10-04 LAB — I-STAT 8, (EC8 V) (CONVERTED LAB)
BUN: 9
Bicarbonate: 27.3 — ABNORMAL HIGH
Glucose, Bld: 138 — ABNORMAL HIGH
Hemoglobin: 12.2
TCO2: 29
pCO2, Ven: 40.5 — ABNORMAL LOW
pH, Ven: 7.437 — ABNORMAL HIGH

## 2010-10-04 LAB — POCT URINALYSIS DIP (DEVICE)
Bilirubin Urine: NEGATIVE
Glucose, UA: NEGATIVE
Nitrite: NEGATIVE
Operator id: 116391
Urobilinogen, UA: 0.2

## 2010-10-04 LAB — CBC
HCT: 31.2 — ABNORMAL LOW
MCV: 73.2 — ABNORMAL LOW
Platelets: 298
RDW: 17.8 — ABNORMAL HIGH
WBC: 7.1

## 2010-10-04 LAB — POCT PREGNANCY, URINE
Operator id: 116391
Preg Test, Ur: NEGATIVE
Preg Test, Ur: NEGATIVE

## 2010-10-04 LAB — POCT I-STAT CREATININE: Creatinine, Ser: 0.8

## 2010-10-08 LAB — WET PREP, GENITAL
Clue Cells Wet Prep HPF POC: NONE SEEN
Trich, Wet Prep: NONE SEEN

## 2010-10-08 LAB — POCT URINALYSIS DIP (DEVICE)
Nitrite: POSITIVE — AB
Operator id: 247071
pH: 5.5

## 2010-10-08 LAB — URINALYSIS, ROUTINE W REFLEX MICROSCOPIC
Hgb urine dipstick: NEGATIVE
Nitrite: NEGATIVE
Protein, ur: NEGATIVE
Urobilinogen, UA: 0.2

## 2010-10-08 LAB — POCT I-STAT, CHEM 8
BUN: 8
Creatinine, Ser: 0.8
Glucose, Bld: 113 — ABNORMAL HIGH
Potassium: 3.8
Sodium: 140
TCO2: 25

## 2010-10-08 LAB — GC/CHLAMYDIA PROBE AMP, GENITAL: GC Probe Amp, Genital: NEGATIVE

## 2010-10-08 LAB — CBC
Hemoglobin: 11 — ABNORMAL LOW
MCHC: 33.2
RBC: 4.41
WBC: 9.6

## 2010-10-08 LAB — POCT PREGNANCY, URINE
Operator id: 247071
Preg Test, Ur: NEGATIVE
Preg Test, Ur: NEGATIVE

## 2010-10-08 LAB — URINE CULTURE

## 2010-10-08 LAB — HCG, SERUM, QUALITATIVE: Preg, Serum: NEGATIVE

## 2010-10-24 LAB — POCT PREGNANCY, URINE: Operator id: 247071

## 2010-10-24 LAB — POCT URINALYSIS DIP (DEVICE)
Bilirubin Urine: NEGATIVE
Glucose, UA: NEGATIVE
Specific Gravity, Urine: 1.02

## 2010-10-29 LAB — URINALYSIS, ROUTINE W REFLEX MICROSCOPIC
Nitrite: NEGATIVE
Specific Gravity, Urine: <1.005 — ABNORMAL LOW
pH: 6.5

## 2010-10-29 LAB — CBC
Platelets: 281
WBC: 7.1

## 2010-10-29 LAB — POCT PREGNANCY, URINE
Operator id: 22333
Preg Test, Ur: NEGATIVE

## 2010-10-29 LAB — WET PREP, GENITAL
Trich, Wet Prep: NONE SEEN
Yeast Wet Prep HPF POC: NONE SEEN

## 2010-10-29 LAB — GC/CHLAMYDIA PROBE AMP, GENITAL: GC Probe Amp, Genital: NEGATIVE

## 2010-12-21 ENCOUNTER — Emergency Department (HOSPITAL_COMMUNITY)
Admission: EM | Admit: 2010-12-21 | Discharge: 2010-12-21 | Discharge status: Against Medical Advice | Payer: Self-pay | Attending: Emergency Medicine | Admitting: Emergency Medicine

## 2010-12-21 ENCOUNTER — Other Ambulatory Visit: Payer: Self-pay

## 2010-12-21 ENCOUNTER — Emergency Department (HOSPITAL_COMMUNITY): Payer: Self-pay

## 2010-12-21 ENCOUNTER — Encounter: Payer: Self-pay | Admitting: *Deleted

## 2010-12-21 DIAGNOSIS — M549 Dorsalgia, unspecified: Secondary | ICD-10-CM | POA: Insufficient documentation

## 2010-12-21 DIAGNOSIS — R079 Chest pain, unspecified: Secondary | ICD-10-CM | POA: Insufficient documentation

## 2010-12-21 DIAGNOSIS — R109 Unspecified abdominal pain: Secondary | ICD-10-CM | POA: Insufficient documentation

## 2010-12-21 LAB — DIFFERENTIAL
Eosinophils Absolute: 0.1 10*3/uL (ref 0.0–0.7)
Eosinophils Relative: 3 % (ref 0–5)
Lymphocytes Relative: 38 % (ref 12–46)
Lymphs Abs: 2 10*3/uL (ref 0.7–4.0)
Monocytes Absolute: 0.4 10*3/uL (ref 0.1–1.0)

## 2010-12-21 LAB — CBC
MCH: 25.1 pg — ABNORMAL LOW (ref 26.0–34.0)
MCV: 78.3 fL (ref 78.0–100.0)
Platelets: 230 10*3/uL (ref 150–400)
RBC: 4.23 MIL/uL (ref 3.87–5.11)

## 2010-12-21 LAB — POCT I-STAT, CHEM 8
BUN: 19 mg/dL (ref 6–23)
Calcium, Ion: 1.21 mmol/L (ref 1.12–1.32)
Creatinine, Ser: 0.8 mg/dL (ref 0.50–1.10)
Glucose, Bld: 146 mg/dL — ABNORMAL HIGH (ref 70–99)
Hemoglobin: 11.9 g/dL — ABNORMAL LOW (ref 12.0–15.0)
Sodium: 140 mEq/L (ref 135–145)
TCO2: 25 mmol/L (ref 0–100)

## 2010-12-21 NOTE — ED Notes (Signed)
Pt states c/o L upper back pain radiating from shoulder blade to upper back. Pt c/o substernal chest pain starting after the back pain, then abdominal pain, n/v. Pt denies cardiac hx and lung disease. Pt in no acute distress at this time. Pt states slightly short of breath when reclined.

## 2011-02-09 ENCOUNTER — Emergency Department (HOSPITAL_COMMUNITY)
Admission: EM | Admit: 2011-02-09 | Discharge: 2011-02-09 | Disposition: A | Discharge status: Home / Self Care | Payer: PRIVATE HEALTH INSURANCE | Attending: Emergency Medicine | Admitting: Emergency Medicine

## 2011-02-09 ENCOUNTER — Emergency Department (HOSPITAL_COMMUNITY): Payer: PRIVATE HEALTH INSURANCE

## 2011-02-09 ENCOUNTER — Encounter (HOSPITAL_COMMUNITY): Payer: Self-pay | Admitting: *Deleted

## 2011-02-09 DIAGNOSIS — E119 Type 2 diabetes mellitus without complications: Secondary | ICD-10-CM | POA: Insufficient documentation

## 2011-02-09 DIAGNOSIS — M79651 Pain in right thigh: Secondary | ICD-10-CM

## 2011-02-09 DIAGNOSIS — M79609 Pain in unspecified limb: Secondary | ICD-10-CM | POA: Insufficient documentation

## 2011-02-09 MED ORDER — HYDROCODONE-ACETAMINOPHEN 5-325 MG PO TABS: 1.0000 | ORAL_TABLET | Freq: Four times a day (QID) | ORAL | Status: AC | PRN

## 2011-02-09 MED ORDER — HYDROMORPHONE HCL PF 2 MG/ML IJ SOLN
2.0000 mg | Freq: Once | INTRAMUSCULAR | Status: AC
  Administered 2011-02-09: 2 mg via INTRAMUSCULAR
  Filled 2011-02-09: qty 1

## 2011-02-09 NOTE — ED Notes (Signed)
Pt stated she bent over today to pick up her laptop and heard something pop in the middle of her back.  Pt stated she is experiencing right thigh pain and right hip pain. Pt states she took valium, oxycodone (5-325), and Ibuprofen 800 mg with no relief.  Pt states she took the medication yesterday at 1500.

## 2011-02-09 NOTE — ED Provider Notes (Signed)
History     CSN: [id number]  Arrival date & time 02/09/11  0021   First MD Initiated Contact with Patient 02/09/11 0101      Chief Complaint  Patient presents with  . Back Pain    (Consider location/radiation/quality/duration/timing/severity/associated sxs/prior treatment) Patient is a 41 y.o. female presenting with leg pain. The history is provided by the patient.  Leg Pain  The incident occurred more than 2 days ago. There was no injury mechanism. The pain is present in the right thigh. The pain is moderate. The pain has been constant since onset. Pertinent negatives include no numbness, no inability to bear weight, no muscle weakness, no loss of sensation and no tingling. The symptoms are aggravated by activity. She has tried NSAIDs for the symptoms. The treatment provided no relief.  Pt was seen in a medical facility in [place] and was given pain medications.  She was told she might have sciatica.  Pt states the pain is only in her thigh now.  She has no back pain.  No numbness or weakness.  It hurts to move her leg and walk.  This evening she felt a pop in her back while bending over.  Past Medical History  Diagnosis Date  . Diabetes mellitus     Past Surgical History  Procedure Date  . Urethral diverticulum repair   . Cesarean section     x 2    History reviewed. No pertinent family history.  History  Substance Use Topics  . Smoking status: Never Smoker   . Smokeless tobacco: Not on file  . Alcohol Use: No    OB History    Grav Para Term Preterm Abortions TAB SAB Ect Mult Living                  Review of Systems  Neurological: Negative for tingling and numbness.  All other systems reviewed and are negative.    Allergies  Sulfamethoxazole w/trimethoprim and Sulfonamide derivatives  Home Medications   Current Outpatient Rx  Name Route Sig Dispense Refill  . FERROUS SULFATE 325 (65 FE) MG PO TABS Oral Take 325 mg by mouth daily with breakfast.        . IBUPROFEN 200 MG PO TABS Oral Take 200 mg by mouth every 6 (six) hours as needed. For pain    . PHENTERMINE HCL 37.5 MG PO CAPS Oral Take 37.5 mg by mouth every morning.      BP 134/87  Pulse 96  Temp(Src) 97.5 F (36.4 C) (Oral)  Resp 16  SpO2 100%  LMP 01/26/2011  Physical Exam  Nursing note and vitals reviewed. Constitutional: She appears well-developed and well-nourished. No distress.  HENT:  Head: Normocephalic and atraumatic.  Right Ear: External ear normal.  Left Ear: External ear normal.  Eyes: Conjunctivae are normal. Right eye exhibits no discharge. Left eye exhibits no discharge. No scleral icterus.  Neck: Neck supple. No tracheal deviation present.  Cardiovascular: Normal rate.   Pulmonary/Chest: Effort normal. No stridor. No respiratory distress.  Musculoskeletal: She exhibits no edema and no tenderness.       No mass or tenderness although pain in right thigh with extension, strong dorsalis pedis pulse bilaterally, no edema or tenderness in her calves  Neurological: She is alert. Cranial nerve deficit: no gross deficits.  Skin: Skin is warm and dry. No rash noted.  Psychiatric: She has a normal mood and affect.    ED Course  Procedures (including critical care time)  Labs  Reviewed - No data to display Dg Hip Complete Right  02/09/2011  *RADIOLOGY REPORT*  Clinical Data: Right hip injury and pain.  Inability to bear weight.  RIGHT HIP - COMPLETE 2+ VIEW  Comparison:  04/13/2009  Findings:  There is no evidence of hip fracture or dislocation. There is no evidence of arthropathy or other focal bone abnormality.  IMPRESSION: Negative.  Original Report Authenticated By: Danae Orleans, M.D.     1. Pain of right thigh       MDM  Patient without signs of acute vascular emergency. I doubt DVT. There is no evidence of infection on exam. There is no mass or abnormality noted on her thigh. Patient had some back pain earlier suggesting the possibility of sciatica  although that is not clearly a component of her symptoms at this time.  Regardless at this time I do not feel there is any evidence to suggest an acute emergency medical condition. She will be treated with medications for pain and recommend follow up with an orthopedic Dr.        Celene Kras, MD 02/09/11 [phone]

## 2011-02-09 NOTE — ED Notes (Signed)
The pt has had lower back pain and pain down her rt thigh for one week.  She has also had some sciatic nerve probelms

## 2011-08-23 ENCOUNTER — Inpatient Hospital Stay (HOSPITAL_COMMUNITY): Payer: PRIVATE HEALTH INSURANCE

## 2011-08-23 ENCOUNTER — Encounter (HOSPITAL_COMMUNITY): Payer: Self-pay | Admitting: *Deleted

## 2011-08-23 ENCOUNTER — Encounter (HOSPITAL_COMMUNITY): Payer: Self-pay | Admitting: Anesthesiology

## 2011-08-23 ENCOUNTER — Inpatient Hospital Stay (HOSPITAL_COMMUNITY): Payer: PRIVATE HEALTH INSURANCE | Admitting: Anesthesiology

## 2011-08-23 ENCOUNTER — Ambulatory Visit (HOSPITAL_COMMUNITY)
Admission: AD | Admit: 2011-08-23 | Discharge: 2011-08-24 | Disposition: A | Discharge status: Home / Self Care | Payer: PRIVATE HEALTH INSURANCE | Source: Ambulatory Visit | Attending: Surgery | Admitting: Surgery

## 2011-08-23 ENCOUNTER — Encounter (HOSPITAL_COMMUNITY): Admission: AD | Disposition: A | Discharge status: Home / Self Care | Payer: Self-pay | Source: Ambulatory Visit

## 2011-08-23 ENCOUNTER — Encounter (HOSPITAL_COMMUNITY): Payer: Self-pay

## 2011-08-23 DIAGNOSIS — K8010 Calculus of gallbladder with chronic cholecystitis without obstruction: Secondary | ICD-10-CM

## 2011-08-23 DIAGNOSIS — K8020 Calculus of gallbladder without cholecystitis without obstruction: Secondary | ICD-10-CM | POA: Insufficient documentation

## 2011-08-23 DIAGNOSIS — E785 Hyperlipidemia, unspecified: Secondary | ICD-10-CM | POA: Insufficient documentation

## 2011-08-23 DIAGNOSIS — E119 Type 2 diabetes mellitus without complications: Secondary | ICD-10-CM | POA: Insufficient documentation

## 2011-08-23 DIAGNOSIS — Z79899 Other long term (current) drug therapy: Secondary | ICD-10-CM | POA: Insufficient documentation

## 2011-08-23 HISTORY — PX: CHOLECYSTECTOMY: SHX55

## 2011-08-23 HISTORY — DX: Calculus of gallbladder without cholecystitis without obstruction: K80.20

## 2011-08-23 LAB — COMPREHENSIVE METABOLIC PANEL
Albumin: 3.6 g/dL (ref 3.5–5.2)
BUN: 14 mg/dL (ref 6–23)
CO2: 23 mEq/L (ref 19–32)
Calcium: 8.7 mg/dL (ref 8.4–10.5)
Chloride: 98 mEq/L (ref 96–112)
Creatinine, Ser: 0.71 mg/dL (ref 0.50–1.10)
GFR calc non Af Amer: >90 mL/min (ref >90)
Total Bilirubin: 0.5 mg/dL (ref 0.3–1.2)

## 2011-08-23 LAB — CBC WITH DIFFERENTIAL/PLATELET
Basophils Relative: 0 % (ref 0–1)
Eosinophils Relative: 1 % (ref 0–5)
HCT: 34.9 % — ABNORMAL LOW (ref 36.0–46.0)
Hemoglobin: 11 g/dL — ABNORMAL LOW (ref 12.0–15.0)
MCHC: 31.5 g/dL (ref 30.0–36.0)
MCV: 77.9 fL — ABNORMAL LOW (ref 78.0–100.0)
Monocytes Absolute: 0.4 10*3/uL (ref 0.1–1.0)
Monocytes Relative: 6 % (ref 3–12)
Neutro Abs: 3 10*3/uL (ref 1.7–7.7)
RDW: 14.9 % (ref 11.5–15.5)

## 2011-08-23 LAB — GLUCOSE, CAPILLARY: Glucose-Capillary: 143 mg/dL — ABNORMAL HIGH (ref 70–99)

## 2011-08-23 LAB — URINALYSIS, ROUTINE W REFLEX MICROSCOPIC
Bilirubin Urine: NEGATIVE
Glucose, UA: NEGATIVE mg/dL
Hgb urine dipstick: NEGATIVE
Ketones, ur: NEGATIVE mg/dL
Protein, ur: NEGATIVE mg/dL
Urobilinogen, UA: 0.2 mg/dL (ref 0.0–1.0)

## 2011-08-23 LAB — POCT PREGNANCY, URINE: Preg Test, Ur: NEGATIVE

## 2011-08-23 LAB — AMYLASE: Amylase: 95 U/L (ref 0–105)

## 2011-08-23 LAB — LIPASE, BLOOD: Lipase: 57 U/L (ref 11–59)

## 2011-08-23 SURGERY — LAPAROSCOPIC CHOLECYSTECTOMY WITH INTRAOPERATIVE CHOLANGIOGRAM
Anesthesia: General | Site: Abdomen | Wound class: Contaminated

## 2011-08-23 MED ORDER — PROMETHAZINE HCL 25 MG/ML IJ SOLN
25.0000 mg | Freq: Once | INTRAMUSCULAR | Status: AC
  Administered 2011-08-23: 25 mg via INTRAVENOUS
  Filled 2011-08-23: qty 1

## 2011-08-23 MED ORDER — ONDANSETRON HCL 4 MG/2ML IJ SOLN
INTRAMUSCULAR | Status: AC
  Filled 2011-08-23: qty 2

## 2011-08-23 MED ORDER — SUCCINYLCHOLINE CHLORIDE 20 MG/ML IJ SOLN
INTRAMUSCULAR | Status: DC | PRN
  Administered 2011-08-23: 100 mg via INTRAVENOUS

## 2011-08-23 MED ORDER — 0.9 % SODIUM CHLORIDE (POUR BTL) OPTIME
TOPICAL | Status: DC | PRN
  Administered 2011-08-23: 1000 mL

## 2011-08-23 MED ORDER — PROPOFOL 10 MG/ML IV EMUL
INTRAVENOUS | Status: DC | PRN
  Administered 2011-08-23: 180 mg via INTRAVENOUS

## 2011-08-23 MED ORDER — SODIUM CHLORIDE 0.9 % IV SOLN
3.0000 g | Freq: Four times a day (QID) | INTRAVENOUS | Status: AC
  Administered 2011-08-23 – 2011-08-24 (×3): 3 g via INTRAVENOUS
  Filled 2011-08-23 (×3): qty 3

## 2011-08-23 MED ORDER — NEOSTIGMINE METHYLSULFATE 1 MG/ML IJ SOLN
INTRAMUSCULAR | Status: DC | PRN
  Administered 2011-08-23: 3 mg via INTRAVENOUS

## 2011-08-23 MED ORDER — LACTATED RINGERS IV SOLN
INTRAVENOUS | Status: DC | PRN
  Administered 2011-08-23: 14:00:00 via INTRAVENOUS

## 2011-08-23 MED ORDER — IOHEXOL 300 MG/ML  SOLN
INTRAMUSCULAR | Status: DC | PRN
  Administered 2011-08-23: 50 mL

## 2011-08-23 MED ORDER — MORPHINE SULFATE 4 MG/ML IJ SOLN
INTRAMUSCULAR | Status: AC
  Filled 2011-08-23: qty 1

## 2011-08-23 MED ORDER — LIDOCAINE HCL (CARDIAC) 20 MG/ML IV SOLN
INTRAVENOUS | Status: DC | PRN
  Administered 2011-08-23: 100 mg via INTRAVENOUS

## 2011-08-23 MED ORDER — HYDROMORPHONE HCL PF 1 MG/ML IJ SOLN
0.2500 mg | INTRAMUSCULAR | Status: DC | PRN
  Administered 2011-08-23 (×2): 0.5 mg via INTRAVENOUS

## 2011-08-23 MED ORDER — SODIUM CHLORIDE 0.9 % IV SOLN
INTRAVENOUS | Status: DC | PRN
  Administered 2011-08-23: 13:00:00 via INTRAVENOUS

## 2011-08-23 MED ORDER — ACETAMINOPHEN 10 MG/ML IV SOLN
INTRAVENOUS | Status: DC | PRN
  Administered 2011-08-23: 1000 mg via INTRAVENOUS

## 2011-08-23 MED ORDER — ONDANSETRON HCL 4 MG/2ML IJ SOLN
INTRAMUSCULAR | Status: DC | PRN
  Administered 2011-08-23: 4 mg via INTRAVENOUS

## 2011-08-23 MED ORDER — BUPIVACAINE HCL (PF) 0.5 % IJ SOLN
INTRAMUSCULAR | Status: AC
  Filled 2011-08-23: qty 30

## 2011-08-23 MED ORDER — ONDANSETRON HCL 4 MG/2ML IJ SOLN
4.0000 mg | Freq: Four times a day (QID) | INTRAMUSCULAR | Status: DC | PRN
  Administered 2011-08-24: 4 mg via INTRAVENOUS
  Filled 2011-08-23: qty 2

## 2011-08-23 MED ORDER — SODIUM CHLORIDE 0.9 % IV SOLN
3.0000 g | Freq: Once | INTRAVENOUS | Status: AC
  Administered 2011-08-23: 3 g via INTRAVENOUS
  Filled 2011-08-23: qty 3

## 2011-08-23 MED ORDER — LACTATED RINGERS IV SOLN
INTRAVENOUS | Status: DC
  Administered 2011-08-23: 08:00:00 via INTRAVENOUS

## 2011-08-23 MED ORDER — KCL IN DEXTROSE-NACL 20-5-0.45 MEQ/L-%-% IV SOLN
INTRAVENOUS | Status: AC
  Administered 2011-08-23: 1000 mL via INTRAVENOUS
  Filled 2011-08-23: qty 1000

## 2011-08-23 MED ORDER — OXYCODONE-ACETAMINOPHEN 5-325 MG PO TABS
1.0000 | ORAL_TABLET | ORAL | Status: DC | PRN
  Administered 2011-08-23 – 2011-08-24 (×3): 2 via ORAL
  Filled 2011-08-23 (×3): qty 2

## 2011-08-23 MED ORDER — LACTATED RINGERS IR SOLN
Status: DC | PRN
  Administered 2011-08-23: 1000 mL

## 2011-08-23 MED ORDER — MIDAZOLAM HCL 5 MG/5ML IJ SOLN
INTRAMUSCULAR | Status: DC | PRN
  Administered 2011-08-23: 2 mg via INTRAVENOUS

## 2011-08-23 MED ORDER — PROMETHAZINE HCL 25 MG/ML IJ SOLN: 6.2500 mg | INTRAMUSCULAR | Status: DC | PRN

## 2011-08-23 MED ORDER — MORPHINE SULFATE 4 MG/ML IJ SOLN
4.0000 mg | Freq: Once | INTRAMUSCULAR | Status: AC
  Administered 2011-08-23: 4 mg via INTRAVENOUS
  Filled 2011-08-23: qty 1

## 2011-08-23 MED ORDER — INSULIN ASPART 100 UNIT/ML ~~LOC~~ SOLN
0.0000 [IU] | Freq: Every day | SUBCUTANEOUS | Status: DC
  Administered 2011-08-23: 2 [IU] via SUBCUTANEOUS

## 2011-08-23 MED ORDER — IOHEXOL 300 MG/ML  SOLN
INTRAMUSCULAR | Status: AC
  Filled 2011-08-23: qty 1

## 2011-08-23 MED ORDER — SUFENTANIL CITRATE 50 MCG/ML IV SOLN
INTRAVENOUS | Status: DC | PRN
  Administered 2011-08-23 (×2): 10 ug via INTRAVENOUS
  Administered 2011-08-23: 20 ug via INTRAVENOUS

## 2011-08-23 MED ORDER — KCL IN DEXTROSE-NACL 20-5-0.45 MEQ/L-%-% IV SOLN
INTRAVENOUS | Status: DC
  Administered 2011-08-23: 1000 mL via INTRAVENOUS
  Administered 2011-08-24: 05:00:00 via INTRAVENOUS
  Filled 2011-08-23 (×3): qty 1000

## 2011-08-23 MED ORDER — KETOROLAC TROMETHAMINE 30 MG/ML IJ SOLN: 15.0000 mg | Freq: Once | INTRAMUSCULAR | Status: DC | PRN

## 2011-08-23 MED ORDER — CISATRACURIUM BESYLATE (PF) 10 MG/5ML IV SOLN
INTRAVENOUS | Status: DC | PRN
  Administered 2011-08-23: 5 mg via INTRAVENOUS

## 2011-08-23 MED ORDER — HYDROMORPHONE HCL PF 1 MG/ML IJ SOLN
1.0000 mg | Freq: Once | INTRAMUSCULAR | Status: AC
  Administered 2011-08-23: 1 mg via INTRAVENOUS
  Filled 2011-08-23: qty 1

## 2011-08-23 MED ORDER — MORPHINE SULFATE 4 MG/ML IJ SOLN
4.0000 mg | Freq: Once | INTRAMUSCULAR | Status: AC
  Administered 2011-08-23: 4 mg via INTRAVENOUS

## 2011-08-23 MED ORDER — DEXAMETHASONE SODIUM PHOSPHATE 4 MG/ML IJ SOLN
INTRAMUSCULAR | Status: DC | PRN
  Administered 2011-08-23: 10 mg via INTRAVENOUS

## 2011-08-23 MED ORDER — ACETAMINOPHEN 10 MG/ML IV SOLN
INTRAVENOUS | Status: AC
  Filled 2011-08-23: qty 100

## 2011-08-23 MED ORDER — MORPHINE SULFATE 2 MG/ML IJ SOLN: 2.0000 mg | INTRAMUSCULAR | Status: DC | PRN

## 2011-08-23 MED ORDER — HYDROMORPHONE HCL PF 1 MG/ML IJ SOLN
INTRAMUSCULAR | Status: AC
  Filled 2011-08-23: qty 1

## 2011-08-23 MED ORDER — INSULIN ASPART 100 UNIT/ML ~~LOC~~ SOLN: 0.0000 [IU] | Freq: Three times a day (TID) | SUBCUTANEOUS | Status: DC

## 2011-08-23 MED ORDER — BUPIVACAINE HCL 0.5 % IJ SOLN
INTRAMUSCULAR | Status: DC | PRN
  Administered 2011-08-23: 15 mL

## 2011-08-23 MED ORDER — ONDANSETRON HCL 4 MG PO TABS: 4.0000 mg | ORAL_TABLET | ORAL | Status: DC | PRN

## 2011-08-23 MED ORDER — GLYCOPYRROLATE 0.2 MG/ML IJ SOLN
INTRAMUSCULAR | Status: DC | PRN
  Administered 2011-08-23: .4 mg via INTRAVENOUS

## 2011-08-23 SURGICAL SUPPLY — 49 items
APL SKNCLS STERI-STRIP NONHPOA (GAUZE/BANDAGES/DRESSINGS) ×1
APPLIER CLIP 5 13 M/L LIGAMAX5 (MISCELLANEOUS) ×2
APPLIER CLIP ROT 10 11.4 M/L (STAPLE)
APR CLP MED LRG 11.4X10 (STAPLE)
APR CLP MED LRG 5 ANG JAW (MISCELLANEOUS) ×1
BENZOIN TINCTURE PRP APPL 2/3 (GAUZE/BANDAGES/DRESSINGS) ×2 IMPLANT
CANISTER SUCTION 2500CC (MISCELLANEOUS) ×2 IMPLANT
CHLORAPREP W/TINT 26ML (MISCELLANEOUS) ×2 IMPLANT
CLIP APPLIE 5 13 M/L LIGAMAX5 (MISCELLANEOUS) ×1 IMPLANT
CLIP APPLIE ROT 10 11.4 M/L (STAPLE) IMPLANT
CLOTH BEACON ORANGE TIMEOUT ST (SAFETY) ×2 IMPLANT
CLSR STERI-STRIP ANTIMIC 1/2X4 (GAUZE/BANDAGES/DRESSINGS) ×2 IMPLANT
COVER MAYO STAND STRL (DRAPES) ×2 IMPLANT
COVER SURGICAL LIGHT HANDLE (MISCELLANEOUS) ×2 IMPLANT
DECANTER SPIKE VIAL GLASS SM (MISCELLANEOUS) ×2 IMPLANT
DRAPE C-ARM 42X72 X-RAY (DRAPES) ×2 IMPLANT
DRAPE LAPAROSCOPIC ABDOMINAL (DRAPES) ×2 IMPLANT
DRAPE UTILITY XL STRL (DRAPES) ×2 IMPLANT
DRSG TEGADERM 2-3/8X2-3/4 SM (GAUZE/BANDAGES/DRESSINGS) ×2 IMPLANT
ELECT REM PT RETURN 9FT ADLT (ELECTROSURGICAL) ×2
ELECTRODE REM PT RTRN 9FT ADLT (ELECTROSURGICAL) ×1 IMPLANT
ENDOLOOP SUT PDS II  0 18 (SUTURE)
ENDOLOOP SUT PDS II 0 18 (SUTURE) IMPLANT
GAUZE SPONGE 2X2 8PLY STRL LF (GAUZE/BANDAGES/DRESSINGS) ×1 IMPLANT
GLOVE BIOGEL PI IND STRL 7.0 (GLOVE) ×1 IMPLANT
GLOVE BIOGEL PI INDICATOR 7.0 (GLOVE) ×1
GLOVE ECLIPSE 8.0 STRL XLNG CF (GLOVE) ×2 IMPLANT
GLOVE INDICATOR 8.0 STRL GRN (GLOVE) ×4 IMPLANT
GOWN STRL NON-REIN LRG LVL3 (GOWN DISPOSABLE) ×2 IMPLANT
GOWN STRL REIN XL XLG (GOWN DISPOSABLE) ×4 IMPLANT
HEMOSTAT SNOW SURGICEL 2X4 (HEMOSTASIS) ×2 IMPLANT
HEMOSTAT SURGICEL 4X8 (HEMOSTASIS) IMPLANT
IV CATH 14GX2 1/4 (CATHETERS) IMPLANT
KIT BASIN OR (CUSTOM PROCEDURE TRAY) ×2 IMPLANT
NS IRRIG 1000ML POUR BTL (IV SOLUTION) ×2 IMPLANT
POUCH SPECIMEN RETRIEVAL 10MM (ENDOMECHANICALS) ×2 IMPLANT
SET CHOLANGIOGRAPH MIX (MISCELLANEOUS) ×2 IMPLANT
SET IRRIG TUBING LAPAROSCOPIC (IRRIGATION / IRRIGATOR) ×2 IMPLANT
SOLUTION ANTI FOG 6CC (MISCELLANEOUS) ×2 IMPLANT
SPONGE GAUZE 2X2 STER 10/PKG (GAUZE/BANDAGES/DRESSINGS) ×1
STRIP CLOSURE SKIN 1/2X4 (GAUZE/BANDAGES/DRESSINGS) ×2 IMPLANT
SUT MNCRL AB 4-0 PS2 18 (SUTURE) ×2 IMPLANT
SUT VICRYL 0 UR6 27IN ABS (SUTURE) ×2 IMPLANT
TOWEL OR 17X26 10 PK STRL BLUE (TOWEL DISPOSABLE) ×2 IMPLANT
TRAY LAP CHOLE (CUSTOM PROCEDURE TRAY) ×2 IMPLANT
TROCAR BLADELESS OPT 5 75 (ENDOMECHANICALS) ×6 IMPLANT
TROCAR XCEL BLUNT TIP 100MML (ENDOMECHANICALS) ×2 IMPLANT
TROCAR XCEL NON-BLD 11X100MML (ENDOMECHANICALS) IMPLANT
TUBING INSUFFLATION 10FT LAP (TUBING) ×2 IMPLANT

## 2011-08-23 NOTE — MAU Note (Signed)
Pt reports she has URQ pain tha t started last night. Feels like her gallstones,

## 2011-08-23 NOTE — Anesthesia Preprocedure Evaluation (Signed)
Anesthesia Evaluation  Patient identified by MRN, date of birth, ID band Patient awake    Reviewed: Allergy & Precautions, H&P , NPO status , Patient's Chart, lab work & pertinent test results  Airway Mallampati: II TM Distance: <3 FB Neck ROM: Full    Dental No notable dental hx.    Pulmonary neg pulmonary ROS,  breath sounds clear to auscultation  Pulmonary exam normal       Cardiovascular negative cardio ROS  Rhythm:Regular Rate:Normal     Neuro/Psych negative neurological ROS  negative psych ROS   GI/Hepatic negative GI ROS, Neg liver ROS,   Endo/Other  Oral Hypoglycemic Agents  Renal/GU negative Renal ROS  negative genitourinary   Musculoskeletal negative musculoskeletal ROS (+)   Abdominal   Peds negative pediatric ROS (+)  Hematology negative hematology ROS (+)   Anesthesia Other Findings   Reproductive/Obstetrics negative OB ROS                           Anesthesia Physical Anesthesia Plan  ASA: II  Anesthesia Plan: General   Post-op Pain Management:    Induction: Intravenous  Airway Management Planned: Oral ETT  Additional Equipment:   Intra-op Plan:   Post-operative Plan: Extubation in OR  Informed Consent: I have reviewed the patients History and Physical, chart, labs and discussed the procedure including the risks, benefits and alternatives for the proposed anesthesia with the patient or authorized representative who has indicated his/her understanding and acceptance.   Dental advisory given  Plan Discussed with: CRNA and Surgeon  Anesthesia Plan Comments:         Anesthesia Quick Evaluation

## 2011-08-23 NOTE — ED Notes (Signed)
ZOX:[id number]<AV> Expected date:08/23/11<BR> Expected time: 9:41 AM<BR> Means of arrival:Ambulance<BR> Comments:<BR> [facility] transfer from [facility]

## 2011-08-23 NOTE — Anesthesia Procedure Notes (Signed)
Procedure Name: Intubation Date/Time: 08/23/2011 1:10 PM Performed by: Leroy Libman L Patient Re-evaluated:Patient Re-evaluated prior to inductionOxygen Delivery Method: Circle system utilized Preoxygenation: Pre-oxygenation with 100% oxygen Intubation Type: IV induction Ventilation: Mask ventilation without difficulty and Oral airway inserted - appropriate to patient size Laryngoscope Size: Hyacinth Meeker and 2 Grade View: Grade I Tube type: Oral Tube size: 7.5 mm Number of attempts: 1 Airway Equipment and Method: Stylet Placement Confirmation: ETT inserted through vocal cords under direct vision,  breath sounds checked- equal and bilateral and positive ETCO2 Secured at: 22 cm Tube secured with: Tape Dental Injury: Teeth and Oropharynx as per pre-operative assessment

## 2011-08-23 NOTE — Preoperative (Signed)
Beta Blockers   Reason not to administer Beta Blockers:Not Applicable 

## 2011-08-23 NOTE — Transfer of Care (Signed)
Immediate Anesthesia Transfer of Care Note  Patient: Belinda Stewart  Procedure(s) Performed: Procedure(s) (LRB): LAPAROSCOPIC CHOLECYSTECTOMY WITH INTRAOPERATIVE CHOLANGIOGRAM (N/A)  Patient Location: PACU  Anesthesia Type: General  Level of Consciousness: awake, alert  and oriented  Airway & Oxygen Therapy: Patient Spontanous Breathing and Patient connected to face mask oxygen  Post-op Assessment: Report given to PACU RN and Post -op Vital signs reviewed and stable  Post vital signs: Reviewed and stable  Complications: No apparent anesthesia complications

## 2011-08-23 NOTE — ED Notes (Signed)
To ED via [facility], transfer from [facility], with dx gallstones. Has hx of same. Has received MSO4 x 8mg , Zofran 4mg  -- pain at 8/10. IV 20G left thumb- NS

## 2011-08-23 NOTE — ED Notes (Signed)
Ginger-ale given , vomited after drinking

## 2011-08-23 NOTE — MAU Provider Note (Signed)
Medical Screening exam and patient care preformed by advanced practice provider.  Agree with the above management.  

## 2011-08-23 NOTE — Anesthesia Postprocedure Evaluation (Signed)
  Anesthesia Post-op Note  Patient: Belinda Stewart  Procedure(s) Performed: Procedure(s) (LRB): LAPAROSCOPIC CHOLECYSTECTOMY WITH INTRAOPERATIVE CHOLANGIOGRAM (N/A)  Patient Location: PACU  Anesthesia Type: General  Level of Consciousness: awake and alert   Airway and Oxygen Therapy: Patient Spontanous Breathing  Post-op Pain: mild  Post-op Assessment: Post-op Vital signs reviewed, Patient's Cardiovascular Status Stable, Respiratory Function Stable, Patent Airway and No signs of Nausea or vomiting  Post-op Vital Signs: stable  Complications: No apparent anesthesia complications

## 2011-08-23 NOTE — H&P (Signed)
Belinda Stewart is an 41 y.o. female.   Chief Complaint:   Persistent right upper quadrant pain with nausea and vomiting HPI: She presented to the emergency department [facility] and was transferred to [facility] long for evaluation of the above complaints. The pain began last night and escalated and did not let up. It was associated with nausea and vomiting.  She has known gallstones from previous episodes of pain that were self-limited.  Ultrasound done during this visit demonstrates again a 2 gallstones with one appearing to be lodged in the neck of the gallbladder. No obvious pericholecystic fluid or gallbladder wall thickening.  She gets transient relief from IV narcotics but when the narcotic wears off the pain returns.  Past Medical History  Diagnosis Date  . Diabetes mellitus   . Gallstones     Past Surgical History  Procedure Date  . Urethral diverticulum repair   . Cesarean section     x 2    Family History  Problem Relation Age of Onset  . Other Neg Hx    Social History:  reports that she has never smoked. She does not have any smokeless tobacco history on file. She reports that she does not drink alcohol or use illicit drugs.  Allergies:  Allergies  Allergen Reactions  . Sulfamethoxazole W-Trimethoprim Hives and Swelling  . Sulfonamide Derivatives Hives and Swelling   Prior to Admission medications   Medication Sig Start Date End Date Taking? Authorizing Provider  ferrous sulfate 325 (65 FE) MG tablet Take 325 mg by mouth daily with breakfast.     Yes Historical Provider, MD  metFORMIN (GLUCOPHAGE) 500 MG tablet Take 500 mg by mouth daily with breakfast.   Yes Historical Provider, MD    (Not in a hospital admission)  Results for orders placed during the hospital encounter of 08/23/11 (from the past 48 hour(s))  URINALYSIS, ROUTINE W REFLEX MICROSCOPIC     Status: Abnormal   Collection Time   08/23/11  7:18 AM      Component Value Range Comment   Color, Urine  YELLOW  YELLOW    APPearance CLOUDY (*) CLEAR    Specific Gravity, Urine 1.020  1.005 - 1.030    pH 7.5  5.0 - 8.0    Glucose, UA NEGATIVE  NEGATIVE mg/dL    Hgb urine dipstick NEGATIVE  NEGATIVE    Bilirubin Urine NEGATIVE  NEGATIVE    Ketones, ur NEGATIVE  NEGATIVE mg/dL    Protein, ur NEGATIVE  NEGATIVE mg/dL    Urobilinogen, UA 0.2  0.0 - 1.0 mg/dL    Nitrite NEGATIVE  NEGATIVE    Leukocytes, UA NEGATIVE  NEGATIVE MICROSCOPIC NOT DONE ON URINES WITH NEGATIVE PROTEIN, BLOOD, LEUKOCYTES, NITRITE, OR GLUCOSE <1000 mg/dL.  POCT PREGNANCY, URINE     Status: Normal   Collection Time   08/23/11  7:25 AM      Component Value Range Comment   Preg Test, Ur NEGATIVE  NEGATIVE   CBC WITH DIFFERENTIAL     Status: Abnormal   Collection Time   08/23/11  8:00 AM      Component Value Range Comment   WBC 5.9  4.0 - 10.5 K/uL    RBC 4.48  3.87 - 5.11 MIL/uL    Hemoglobin 11.0 (*) 12.0 - 15.0 g/dL    HCT 14.7 (*) 82.9 - 46.0 %    MCV 77.9 (*) 78.0 - 100.0 fL    MCH 24.6 (*) 26.0 - 34.0 pg  MCHC 31.5  30.0 - 36.0 g/dL    RDW 91.4  78.2 - 95.6 %    Platelets 242  150 - 400 K/uL    Neutrophils Relative 51  43 - 77 %    Neutro Abs 3.0  1.7 - 7.7 K/uL    Lymphocytes Relative 42  12 - 46 %    Lymphs Abs 2.5  0.7 - 4.0 K/uL    Monocytes Relative 6  3 - 12 %    Monocytes Absolute 0.4  0.1 - 1.0 K/uL    Eosinophils Relative 1  0 - 5 %    Eosinophils Absolute 0.1  0.0 - 0.7 K/uL    Basophils Relative 0  0 - 1 %    Basophils Absolute 0.0  0.0 - 0.1 K/uL   COMPREHENSIVE METABOLIC PANEL     Status: Abnormal   Collection Time   08/23/11  8:00 AM      Component Value Range Comment   Sodium 131 (*) 135 - 145 mEq/L    Potassium 3.4 (*) 3.5 - 5.1 mEq/L    Chloride 98  96 - 112 mEq/L    CO2 23  19 - 32 mEq/L    Glucose, Bld 187 (*) 70 - 99 mg/dL    BUN 14  6 - 23 mg/dL    Creatinine, Ser 2.13  0.50 - 1.10 mg/dL    Calcium 8.7  8.4 - 08.6 mg/dL    Total Protein 7.2  6.0 - 8.3 g/dL    Albumin 3.6  3.5  - 5.2 g/dL    AST 16  0 - 37 U/L    ALT 14  0 - 35 U/L    Alkaline Phosphatase 51  39 - 117 U/L    Total Bilirubin 0.5  0.3 - 1.2 mg/dL    GFR calc non Af Amer >90  >90 mL/min    GFR calc Af Amer >90  >90 mL/min   AMYLASE     Status: Normal   Collection Time   08/23/11  8:00 AM      Component Value Range Comment   Amylase 95  0 - 105 U/L   LIPASE, BLOOD     Status: Normal   Collection Time   08/23/11  8:00 AM      Component Value Range Comment   Lipase 57  11 - 59 U/L    US Abdomen Limited  08/23/2011   *RADIOLOGY REPORT*  Clinical Data:  Severe right upper quadrant abdominal pain, known gallstones, vomiting  LIMITED ABDOMINAL ULTRASOUND - RIGHT UPPER QUADRANT  Comparison:  Abdominal ultrasound - 07/27/2010  Findings:  Gallbladder:  Two large (measuring 2.5 and 2.2 cm in diameter) echogenic shadowing gallstones are seen with an otherwise normal- appearing gallbladder.  One of the gallstones is non mobile and appears lodged within the gallbladder neck, however appears grossly unchanged compared to remote abdominal ultrasound performed 04/20/2010.  There is no definite pericholecystic fluid or gallbladder wall thickening.  The patient experienced pain during sonographic evaluation of the gallbladder.  Common bile duct:  Normal in size measuring 6 mm in diameter.  Liver:  Homogeneous hepatic echotexture.  No discrete hepatic lesions.  No definite evidence of intrahepatic biliary ductal dilatation.  No ascites.  IMPRESSION:  Indeterminate finding for acute cholecystitis with two large gallstones are again identified within an otherwise normal- appearing gallbladder.  One of the large gallstones is non mobile lodged within the gallbladder neck, however appears grossly unchanged compared to  remote abdominal ultrasound performed 07/2 12.  No definite gallbladder wall thickening or pericholecystic fluid, however the patient did experience pain with sonographic evaluation of the gallbladder (sonographic  Murphy's sign).  Further evaluation may be performed with a HIDA scan as clinically indicated.                   Original Report Authenticated By: Waynard Reeds, M.D.    Review of Systems  Constitutional: Positive for chills. Negative for fever.  Respiratory: Positive for shortness of breath (with the pain).   Cardiovascular: Negative for chest pain.  Gastrointestinal: Positive for nausea, vomiting and abdominal pain. Negative for heartburn, diarrhea and constipation.  Genitourinary: Negative for hematuria.  Neurological: Negative for seizures and headaches.  Endo/Heme/Allergies: Does not bruise/bleed easily.    Blood pressure 122/70, pulse 81, temperature 97.7 F (36.5 C), temperature source Oral, resp. rate 18, height 5\' 9"  (1.753 m), weight 197 lb (89.359 kg), last menstrual period 07/29/2011, SpO2 100.00%. Physical Exam  Constitutional:       Overweight female who appears to be uncomfortable.  HENT:  Head: Normocephalic and atraumatic.  Eyes: EOM are normal. No scleral icterus.  Neck: Neck supple.  Cardiovascular: Normal rate and regular rhythm.   Respiratory: Effort normal and breath sounds normal.  GI: There is tenderness (ruq). There is guarding (ruq).       Lower transverse scar.  Musculoskeletal: She exhibits no edema.  Lymphadenopathy:    She has no cervical adenopathy.  Neurological: She is alert.  Skin: Skin is warm and dry.     Assessment/Plan Cholelithiasis with devolving acute cholecystitis.  Plan:  Start IV antibiotics. Laparoscopic cholecystectomy with IOC.  I have explained the procedure, risks, and aftercare of cholecystectomy.  Risks include but are not limited to bleeding, infection, wound problems, anesthesia, diarrhea, bile leak, injury to common bile duct/liver/intestine.  She seems to understand and agrees with the plan.   Alaze Garverick J 08/23/2011, 12:13 PM

## 2011-08-23 NOTE — MAU Note (Signed)
Patient states she last ate and drank last night around 10:00 p.m.

## 2011-08-23 NOTE — ED Provider Notes (Addendum)
History     CSN: [id number]  Arrival date & time 08/23/11  0711   First MD Initiated Contact with Patient 08/23/11 [phone]      Chief Complaint  Patient presents with  . Abdominal Pain  . Cholelithiasis    (Consider location/radiation/quality/duration/timing/severity/associated sxs/prior treatment) Patient is a 41 y.o. female presenting with abdominal pain. The history is provided by the patient.  Abdominal Pain The primary symptoms of the illness include abdominal pain, nausea and vomiting. The primary symptoms of the illness do not include fever, shortness of breath, diarrhea or dysuria. The current episode started yesterday (11pm last night). The onset of the illness was sudden. The problem has been gradually worsening.  The abdominal pain is located in the RUQ. The abdominal pain does not radiate. The severity of the abdominal pain is 9/10. The abdominal pain is relieved by nothing. The abdominal pain is exacerbated by vomiting and eating.  Vomiting occurs 2 to 5 times per day. The emesis contains stomach contents.  The patient states that she believes she is currently not pregnant. The patient has not had a change in bowel habit. Additional symptoms associated with the illness include anorexia. Significant associated medical issues include gallstones.    Past Medical History  Diagnosis Date  . Diabetes mellitus   . Gallstones     Past Surgical History  Procedure Date  . Urethral diverticulum repair   . Cesarean section     x 2    Family History  Problem Relation Age of Onset  . Other Neg Hx     History  Substance Use Topics  . Smoking status: Never Smoker   . Smokeless tobacco: Not on file  . Alcohol Use: No    OB History    Grav Para Term Preterm Abortions TAB SAB Ect Mult Living   3 3        3       Review of Systems  Constitutional: Negative for fever.  Respiratory: Negative for shortness of breath.   Gastrointestinal: Positive for nausea, vomiting,  abdominal pain and anorexia. Negative for diarrhea.  Genitourinary: Negative for dysuria.  All other systems reviewed and are negative.    Allergies  Sulfamethoxazole w-trimethoprim and Sulfonamide derivatives  Home Medications   Current Outpatient Rx  Name Route Sig Dispense Refill  . FERROUS SULFATE 325 (65 FE) MG PO TABS Oral Take 325 mg by mouth daily with breakfast.      . METFORMIN HCL 500 MG PO TABS Oral Take 500 mg by mouth daily with breakfast.      BP 120/72  Pulse 113  Temp 97.5 F (36.4 C) (Oral)  Resp 18  Ht 5\' 9"  (1.753 m)  Wt 197 lb (89.359 kg)  BMI 29.09 kg/m2  SpO2 98%  LMP 07/29/2011  Physical Exam  Nursing note and vitals reviewed. Constitutional: She is oriented to person, place, and time. She appears well-developed and well-nourished. She appears distressed.  HENT:  Head: Normocephalic and atraumatic.  Mouth/Throat: Oropharynx is clear and moist.  Eyes: Conjunctivae and EOM are normal. Pupils are equal, round, and reactive to light.  Neck: Normal range of motion. Neck supple.  Cardiovascular: Normal rate, regular rhythm and intact distal pulses.   No murmur heard. Pulmonary/Chest: Effort normal and breath sounds normal. No respiratory distress. She has no wheezes. She has no rales.  Abdominal: Soft. Normal appearance. She exhibits no distension. There is tenderness in the right upper quadrant. There is guarding and positive Murphy's sign. There  is no rebound.  Musculoskeletal: Normal range of motion. She exhibits no edema and no tenderness.  Neurological: She is alert and oriented to person, place, and time.  Skin: Skin is warm and dry. No rash noted. No erythema.  Psychiatric: She has a normal mood and affect. Her behavior is normal.    ED Course  Procedures (including critical care time)  Labs Reviewed  URINALYSIS, ROUTINE W REFLEX MICROSCOPIC - Abnormal; Notable for the following:    APPearance CLOUDY (*)     All other components within  normal limits  CBC WITH DIFFERENTIAL - Abnormal; Notable for the following:    Hemoglobin 11.0 (*)     HCT 34.9 (*)     MCV 77.9 (*)     MCH 24.6 (*)     All other components within normal limits  COMPREHENSIVE METABOLIC PANEL - Abnormal; Notable for the following:    Sodium 131 (*)     Potassium 3.4 (*)     Glucose, Bld 187 (*)     All other components within normal limits  POCT PREGNANCY, URINE  AMYLASE  LIPASE, BLOOD  AMYLASE  COMPREHENSIVE METABOLIC PANEL  LIPASE, BLOOD   US Abdomen Limited  08/23/2011   *RADIOLOGY REPORT*  Clinical Data:  Severe right upper quadrant abdominal pain, known gallstones, vomiting  LIMITED ABDOMINAL ULTRASOUND - RIGHT UPPER QUADRANT  Comparison:  Abdominal ultrasound - 07/27/2010  Findings:  Gallbladder:  Two large (measuring 2.5 and 2.2 cm in diameter) echogenic shadowing gallstones are seen with an otherwise normal- appearing gallbladder.  One of the gallstones is non mobile and appears lodged within the gallbladder neck, however appears grossly unchanged compared to remote abdominal ultrasound performed 04/20/2010.  There is no definite pericholecystic fluid or gallbladder wall thickening.  The patient experienced pain during sonographic evaluation of the gallbladder.  Common bile duct:  Normal in size measuring 6 mm in diameter.  Liver:  Homogeneous hepatic echotexture.  No discrete hepatic lesions.  No definite evidence of intrahepatic biliary ductal dilatation.  No ascites.  IMPRESSION:  Indeterminate finding for acute cholecystitis with two large gallstones are again identified within an otherwise normal- appearing gallbladder.  One of the large gallstones is non mobile lodged within the gallbladder neck, however appears grossly unchanged compared to remote abdominal ultrasound performed 07/2 12.  No definite gallbladder wall thickening or pericholecystic fluid, however the patient did experience pain with sonographic evaluation of the gallbladder  (sonographic Murphy's sign).  Further evaluation may be performed with a HIDA scan as clinically indicated.                   Original Report Authenticated By: Waynard Reeds, M.D.     Date: 10/23/2011  Rate: 82  Rhythm: normal sinus rhythm  QRS Axis: normal  Intervals: normal  ST/T Wave abnormalities: normal  Conduction Disutrbances: none  Narrative Interpretation: unremarkable        1. Gallstones       MDM   [facility] and findings of gallstones on U/S.  Pt had normal WBC count but CMP, lipase currently pending.  Pt still in significant amt of pain despite 8 of morphine.  10:34 AM Labs are normal and on re-eval after dilaudid pt's pain is significantly improved.  Will attempt to po challenge to see if can go home.  11:27 AM Pt failed po challenge with vomiting and return of pain.  Will discuss with surgery.  Gwyneth Sprout, MD 08/23/11 1035  Gwyneth Sprout, MD 08/23/11 1128  Gwyneth Sprout, MD 08/23/11 1133  Gwyneth Sprout, MD 10/23/11 2040

## 2011-08-23 NOTE — Op Note (Signed)
Preoperative diagnosis:    Cholelithiasis with acute cholecystitis Postoperative diagnosis:  Same  Procedure: Laparoscopic cholecystectomy with cholangiogram.  Surgeon: Avel Peace, M.D.  Asst.:  Emelia Loron M.D.  Anesthesia: General and Marcaine local  Indication:   This is a 41 year old female with known cholelithiasis who presented to the emergency department with progressively increasing right upper quadrant pain nausea and vomiting. She did not get complete relief from pain medication and has a guarding and tenderness right upper quadrant. She is brought to the operating room for urgent cholecystectomy.  Technique: She was brought to the operating room, placed supine on the operating table, and a general anesthetic was administered. The abdominal wall was then sterilely prepped and draped. Local anesthetic (Marcaine) was infiltrated in the subumbilical region. A small subumbilical incision was made through the skin, subcutaneous tissue, fascia, and peritoneum entering the peritoneal cavity under direct vision. A pursestring suture of 0 Vicryl was placed around the edges of the fascia. A Hassan trocar was introduced into the peritoneal cavity and a pneumoperitoneum was created by insufflation of carbon dioxide gas. The laparoscope was introduced into the trocar and no underlying bleeding or organ injury was noted. The patient was then placed in the reverse Trendelenburg position with the right side tilted slightly up.  Three more trochars were then placed into the abdominal cavity under laparoscopic vision. One in the epigastric area, and 2 in the right upper quadrant area. The gallbladder was visualized and was distended with acute inflammatory changes.  The gallbladder is decompressed then the fundus was grasped and retracted toward the right shoulder.  Omental adhesions and adhesions between the duodenum and gallbladder were separated bluntly.  The infundibulum was mobilized with  dissection close to the gallbladder and retracted laterally. The cystic duct was identified and a window was created around it. The cystic artery was also identified and a window was created around it. The critical view was achieved. A clip was placed at the neck of the gallbladder. A small incision was made in the cystic duct. A cholangiocatheter was introduced through the anterior abdominal wall and placed in the cystic duct. A intraoperative cholangiogram was then performed.  Under real-time fluoroscopy, dilute contrast was injected into the cystic duct.  The common hepatic duct, the right and left hepatic ducts, and the common duct were all visualized. Contrast drained into the duodenum without obvious evidence of any obstructing ductal lesion. The final report is pending the Radiologist's interpretation.  The cholangiocatheter was removed, the cystic duct was clipped 3 times on the biliary side, and then the cystic duct was divided sharply. No bile leak was noted from the cystic duct stump.  The cystic artery was then clipped and divided. Following this the gallbladder was dissected free from the liver using electrocautery. The gallbladder was then placed in a retrieval bag and removed from the abdominal cavity through the subumbilical incision.  The gallbladder fossa was inspected, irrigated, and bleeding was controlled with electrocautery. Inspection showed that hemostasis was adequate and there was no evidence of bile leak. A piece of Surgicel was placed in the gallbladder fossa. The irrigation fluid was evacuated as much as possible.  The subumbilical trocar was removed and the fascial defect was closed by of a 0 Vicryl pursestring suture under laparoscopic vision.  The remaining trochars were removed and the pneumoperitoneum was released. The skin incisions were closed with 4-0 Monocryl subcuticular stitches. Steri-Strips and sterile dressings were applied.  The procedure was well-tolerated  without any apparent  complications. The patient was taken to the recovery room in satisfactory condition.

## 2011-08-23 NOTE — MAU Provider Note (Addendum)
History     CSN: [id number]  Arrival date and time: 08/23/11 0711   None     Chief Complaint  Patient presents with  . Abdominal Pain   HPI This is a 41 y.o. female who presents with c/o upper abdominal pain since last night with nausea and vomiting of yellow fluid. Denies pain elsewhere. States was diagnosed 2 years ago with gallstones in [facility] ED, but never saw a Careers adviser for this.  OB History    Grav Para Term Preterm Abortions TAB SAB Ect Mult Living   3 3        3       Past Medical History  Diagnosis Date  . Diabetes mellitus   . Gallstones     Past Surgical History  Procedure Date  . Urethral diverticulum repair   . Cesarean section     x 2    Family History  Problem Relation Age of Onset  . Other Neg Hx     History  Substance Use Topics  . Smoking status: Never Smoker   . Smokeless tobacco: Not on file  . Alcohol Use: No    Allergies:  Allergies  Allergen Reactions  . Sulfamethoxazole W-Trimethoprim Hives and Swelling  . Sulfonamide Derivatives Hives and Swelling    Prescriptions prior to admission  Medication Sig Dispense Refill  . ferrous sulfate 325 (65 FE) MG tablet Take 325 mg by mouth daily with breakfast.        . ibuprofen (ADVIL,MOTRIN) 200 MG tablet Take 200 mg by mouth every 6 (six) hours as needed. For pain      . phentermine 37.5 MG capsule Take 37.5 mg by mouth every morning.        ROS See HPI  Physical Exam   Blood pressure 120/72, pulse 113, temperature 97.5 F (36.4 C), temperature source Oral, resp. rate 18, height 5\' 9"  (1.753 m), weight 197 lb (89.359 kg), last menstrual period 07/29/2011.  Physical Exam  Constitutional: She is oriented to person, place, and time. She appears well-developed and well-nourished. She appears distressed (rocking with pain).  Cardiovascular: Normal rate.   Respiratory: Effort normal.  GI: Soft. She exhibits no distension and no mass. There is tenderness (RUQ, +Murphys sign). There is  guarding. There is no rebound.  Musculoskeletal: Normal range of motion.  Neurological: She is alert and oriented to person, place, and time.  Skin: Skin is warm and dry.  Psychiatric: She has a normal mood and affect.    MAU Course  Procedures Clinical Data: Severe right upper quadrant abdominal pain, known  gallstones, vomiting  LIMITED ABDOMINAL ULTRASOUND - RIGHT UPPER QUADRANT  Comparison: Abdominal ultrasound - 07/27/2010  Findings:  Gallbladder: Two large (measuring 2.5 and 2.2 cm in diameter)  echogenic shadowing gallstones are seen with an otherwise normal-  appearing gallbladder. One of the gallstones is non mobile and  appears lodged within the gallbladder neck, however appears grossly  unchanged compared to remote abdominal ultrasound performed  04/20/2010. There is no definite pericholecystic fluid or  gallbladder wall thickening. The patient experienced pain during  sonographic evaluation of the gallbladder.  Common bile duct: Normal in size measuring 6 mm in diameter.  Liver: Homogeneous hepatic echotexture. No discrete hepatic  lesions. No definite evidence of intrahepatic biliary ductal  dilatation. No ascites.  IMPRESSION:  Indeterminate finding for acute cholecystitis with two large  gallstones are again identified within an otherwise normal-  appearing gallbladder. One of the large gallstones is non mobile  lodged within the gallbladder neck, however appears grossly  unchanged compared to remote abdominal ultrasound performed 07/2  12. No definite gallbladder wall thickening or pericholecystic  fluid, however the patient did experience pain with sonographic  evaluation of the gallbladder (sonographic Murphy's sign). Further  evaluation may be performed with a HIDA scan as clinically  indicated.  Labs in process - results not yet available at time of note.  MDM Discussed with Dr Despina Hidden  Assessment and Plan  A:  Probable cholelithiasis and cholecystitis P:   IV hydration       Morphine and Phenergan       CBC, CMET, Amylase, Lipase       [place] of abdomen       Will need to transfer if + for above  0905  Returned from ultrasound.  Preliminary results reviewed - 2 gallstones seen.  Consult with Dr. Denton Lank at [facility] ER.  Client to be transferred for further evaluation.  Client continues to be in pain which was aggravated during the ultrasound.  Will given Morphine 4 mg IV.  [facility] 08/23/2011, 7:40 AM

## 2011-08-24 MED ORDER — OXYCODONE-ACETAMINOPHEN 5-325 MG PO TABS: 1.0000 | ORAL_TABLET | ORAL | Status: AC | PRN

## 2011-08-24 NOTE — Progress Notes (Signed)
1 Day Post-Op  Subjective: Sore around periumbilical incision.  Tolerating some solid food.  Objective: Vital signs in last 24 hours: Temp:  [97.7 F (36.5 C)-99.2 F (37.3 C)] 98.5 F (36.9 C) (08/11 0505) Pulse Rate:  [77-83] 77  (08/11 0505) Resp:  [14-18] 16  (08/11 0505) BP: (101-125)/(62-71) 101/69 mmHg (08/11 0505) SpO2:  [98 %-100 %] 99 % (08/11 0505) Last BM Date: 08/21/11  Intake/Output from previous day: 08/10 0701 - 08/11 0700 In: 3773.7 [P.O.:600; I.V.:2971.7; IV Piggyback:202] Out: 575 [Urine:525; Blood:50] Intake/Output this shift:    PE: Abd-soft, dressings dry  Lab Results:   Basename 08/23/11 0800  WBC 5.9  HGB 11.0*  HCT 34.9*  PLT 242   BMET  Basename 08/23/11 0800  NA 131*  K 3.4*  CL 98  CO2 23  GLUCOSE 187*  BUN 14  CREATININE 0.71  CALCIUM 8.7   PT/INR No results found for this basename: LABPROT:2,INR:2 in the last 72 hours Comprehensive Metabolic Panel:    Component Value Date/Time   NA 131* 08/23/2011 0800   K 3.4* 08/23/2011 0800   CL 98 08/23/2011 0800   CO2 23 08/23/2011 0800   BUN 14 08/23/2011 0800   CREATININE 0.71 08/23/2011 0800   GLUCOSE 187* 08/23/2011 0800   CALCIUM 8.7 08/23/2011 0800   AST 16 08/23/2011 0800   ALT 14 08/23/2011 0800   ALKPHOS 51 08/23/2011 0800   BILITOT 0.5 08/23/2011 0800   PROT 7.2 08/23/2011 0800   ALBUMIN 3.6 08/23/2011 0800     Studies/Results: Dg Cholangiogram Operative  08/23/2011  *RADIOLOGY REPORT*  Clinical Data:   Evaluate for patency of the CBD  INTRAOPERATIVE CHOLANGIOGRAM  Technique:  Cholangiographic images from the C-arm fluoroscopic device were submitted for interpretation post-operatively.  Please see the procedural report for the amount of contrast and the fluoroscopy time utilized.  Comparison:  Right upper quadrant abdominal ultrasound - earlier same day  Findings:  Intraoperative angiographic images of the right upper abdominal quadrant during laparoscopic cholecystectomy are  provided for review.  Surgical clips overlie the expected location of the gallbladder fossa.  Contrast injection demonstrates selective cannulation of the central aspect of the cystic duct.  There is brisk passage of contrast through the central aspect of the cystic duct with filling of a normal caliber common bile duct. There is brisk passage of contrast though the CBD and into the descending portion of the duodenum.  There is minimal reflux of injected contrast into the common hepatic duct and central aspect of the nondilated intrahepatic biliary system.  There are no discrete filling defects within the opacified portions of the biliary system to suggest the presence of choledocholithiasis.  IMPRESSION:  Intraoperative cholangiogram as above.  No discrete filling defects to suggest the presence of choledocholithiasis.  Original Report Authenticated By: Waynard Reeds, M.D.   US Abdomen Limited  08/23/2011   *RADIOLOGY REPORT*  Clinical Data:  Severe right upper quadrant abdominal pain, known gallstones, vomiting  LIMITED ABDOMINAL ULTRASOUND - RIGHT UPPER QUADRANT  Comparison:  Abdominal ultrasound - 07/27/2010  Findings:  Gallbladder:  Two large (measuring 2.5 and 2.2 cm in diameter) echogenic shadowing gallstones are seen with an otherwise normal- appearing gallbladder.  One of the gallstones is non mobile and appears lodged within the gallbladder neck, however appears grossly unchanged compared to remote abdominal ultrasound performed 04/20/2010.  There is no definite pericholecystic fluid or gallbladder wall thickening.  The patient experienced pain during sonographic evaluation of the gallbladder.  Common bile duct:  Normal in size measuring 6 mm in diameter.  Liver:  Homogeneous hepatic echotexture.  No discrete hepatic lesions.  No definite evidence of intrahepatic biliary ductal dilatation.  No ascites.  IMPRESSION:  Indeterminate finding for acute cholecystitis with two large gallstones are again  identified within an otherwise normal- appearing gallbladder.  One of the large gallstones is non mobile lodged within the gallbladder neck, however appears grossly unchanged compared to remote abdominal ultrasound performed 07/2 12.  No definite gallbladder wall thickening or pericholecystic fluid, however the patient did experience pain with sonographic evaluation of the gallbladder (sonographic Murphy's sign).  Further evaluation may be performed with a HIDA scan as clinically indicated.                   Original Report Authenticated By: Waynard Reeds, M.D.    Anti-infectives: Anti-infectives     Start     Dose/Rate Route Frequency Ordered Stop   08/23/11 1600   Ampicillin-Sulbactam (UNASYN) 3 g in sodium chloride 0.9 % 100 mL IVPB        3 g 100 mL/hr over 60 Minutes Intravenous Every 6 hours 08/23/11 1526 08/24/11 0602   08/23/11 1215   Ampicillin-Sulbactam (UNASYN) 3 g in sodium chloride 0.9 % 100 mL IVPB        3 g 100 mL/hr over 60 Minutes Intravenous  Once 08/23/11 1206 08/23/11 1323          Assessment Active Problems: Cholelithiasis with cholecystitis s/p lap chole 08/23/11-stable overnight.    LOS: 1 day   Plan:  Discharge after lunch.  Instructions given.   Agueda Houpt J 08/24/2011

## 2011-08-24 NOTE — Progress Notes (Signed)
[facility]. Assessment unchanged from am. 

## 2011-08-25 ENCOUNTER — Encounter (HOSPITAL_COMMUNITY): Payer: Self-pay | Admitting: General Surgery

## 2011-09-11 ENCOUNTER — Encounter (INDEPENDENT_AMBULATORY_CARE_PROVIDER_SITE_OTHER): Payer: Self-pay | Admitting: General Surgery

## 2011-09-11 ENCOUNTER — Encounter (INDEPENDENT_AMBULATORY_CARE_PROVIDER_SITE_OTHER): Payer: Self-pay

## 2011-09-11 ENCOUNTER — Ambulatory Visit (INDEPENDENT_AMBULATORY_CARE_PROVIDER_SITE_OTHER): Payer: PRIVATE HEALTH INSURANCE | Admitting: General Surgery

## 2011-09-11 VITALS — BP 112/80 | HR 84 | Temp 97.0°F | Resp 16 | Ht 69.0 in | Wt 196.0 lb

## 2011-09-11 DIAGNOSIS — Z9889 Other specified postprocedural states: Secondary | ICD-10-CM

## 2011-09-11 NOTE — Progress Notes (Signed)
Operation:  Laparoscopic cholecystectomy with cholangiogram  Date:  August 23, 2011  Pathology:  Chronic cholecystitis and cholelithiasis  HPI:  She presents for her first postoperative visit. She is a little sore around the umbilicus otherwise is doing well.   Physical Exam: Abdomen is soft incisions are clean, intact, and solid.  Assessment:  Doing well postoperatively to  Plan:  Low fat diet recommended. Activities as tolerated. Return visit as needed.

## 2011-09-11 NOTE — Patient Instructions (Signed)
Low fat diet. Activities as tolerated. 

## 2011-10-22 ENCOUNTER — Other Ambulatory Visit: Payer: Self-pay | Admitting: Obstetrics and Gynecology

## 2011-12-03 ENCOUNTER — Other Ambulatory Visit: Payer: Self-pay | Admitting: Obstetrics and Gynecology

## 2012-04-16 ENCOUNTER — Other Ambulatory Visit: Payer: Self-pay | Admitting: Obstetrics and Gynecology

## 2012-09-07 ENCOUNTER — Other Ambulatory Visit: Payer: Self-pay | Admitting: Obstetrics and Gynecology

## 2012-10-12 ENCOUNTER — Other Ambulatory Visit: Payer: Self-pay | Admitting: Obstetrics and Gynecology

## 2013-03-08 ENCOUNTER — Encounter (HOSPITAL_COMMUNITY): Payer: Self-pay | Admitting: Emergency Medicine

## 2013-03-08 ENCOUNTER — Emergency Department (HOSPITAL_COMMUNITY)
Admission: EM | Admit: 2013-03-08 | Discharge: 2013-03-08 | Disposition: A | Discharge status: Home / Self Care | Payer: PRIVATE HEALTH INSURANCE | Source: Home / Self Care

## 2013-03-08 DIAGNOSIS — W57XXXA Bitten or stung by nonvenomous insect and other nonvenomous arthropods, initial encounter: Principal | ICD-10-CM

## 2013-03-08 DIAGNOSIS — IMO0001 Reserved for inherently not codable concepts without codable children: Secondary | ICD-10-CM

## 2013-03-08 DIAGNOSIS — S40861A Insect bite (nonvenomous) of right upper arm, initial encounter: Secondary | ICD-10-CM

## 2013-03-08 MED ORDER — TRIAMCINOLONE ACETONIDE 0.1 % EX CREA: 1.0000 "application " | TOPICAL_CREAM | Freq: Two times a day (BID) | CUTANEOUS | Status: DC

## 2013-03-08 NOTE — ED Notes (Signed)
Pt triaged and assessed by provider.   Provider in before nurse. 

## 2013-03-08 NOTE — ED Provider Notes (Signed)
CSN: [id number]     Arrival date & time 03/08/13  1428 History   First MD Initiated Contact with Patient 03/08/13 1510     Chief Complaint  Patient presents with  . Insect Bite     (Consider location/radiation/quality/duration/timing/severity/associated sxs/prior Treatment) HPI Comments: This 43 year old female noticed itchy red spots on her right arm yesterday. She is complaining of these lesions associated with redness and itching.   Past Medical History  Diagnosis Date  . Diabetes mellitus   . Gallstones   . Anemia    Past Surgical History  Procedure Laterality Date  . Urethral diverticulum repair    . Cesarean section      x 2  . Cholecystectomy  08/23/2011    Procedure: LAPAROSCOPIC CHOLECYSTECTOMY WITH INTRAOPERATIVE CHOLANGIOGRAM;  Surgeon: Adolph Pollackodd J Rosenbower, MD;  Location: [facility];  Service: General;  Laterality: N/A;   Family History  Problem Relation Age of Onset  . Other Neg Hx   . Cancer Maternal Aunt     breast  . Diabetes Maternal Grandmother    History  Substance Use Topics  . Smoking status: Never Smoker   . Smokeless tobacco: Not on file  . Alcohol Use: No   OB History   Grav Para Term Preterm Abortions TAB SAB Ect Mult Living   3 3        3      Review of Systems  Constitutional: Negative.   HENT: Negative.   Respiratory: Negative.   Gastrointestinal: Negative.   Musculoskeletal: Negative.   Skin: Positive for rash.  Neurological: Negative.       Allergies  Sulfamethoxazole-trimethoprim and Sulfonamide derivatives  Home Medications   Current Outpatient Rx  Name  Route  Sig  Dispense  Refill  . ferrous sulfate 325 (65 FE) MG tablet   Oral   Take 325 mg by mouth daily with breakfast.           . metFORMIN (GLUCOPHAGE) 500 MG tablet   Oral   Take 500 mg by mouth daily with breakfast.         . triamcinolone cream (KENALOG) 0.1 %   Topical   Apply 1 application topically 2 (two) times daily. Apply for 2 weeks. May use on face  30 g   0    BP 157/84  Pulse 74  Temp(Src) 98.7 F (37.1 C) (Oral)  Resp 16  SpO2 100%  LMP 02/13/2013 Physical Exam  Constitutional: She is oriented to person, place, and time. She appears well-developed and well-nourished.  Neck: Normal range of motion. Neck supple.  Cardiovascular: Normal rate.   Pulmonary/Chest: Effort normal. No respiratory distress.  Musculoskeletal: She exhibits no edema and no tenderness.  Neurological: She is alert and oriented to person, place, and time.  Skin: Skin is warm and dry.  The initial lesions described as annular approximately 3-5 cm in diameter red and slightly raised and smooth occurred on the right distal upper arm extensor surface. These lesions began to coalesce last night producing an Erythematous base. The annular lesions can be seen and palpated separately only upper arm. Shortly afterwards followed a straight line of these annular lesions numbering 7 along the extensor surface of the proximal right forearm. No vesicles, draining or bleeding.  Psychiatric: She has a normal mood and affect.    ED Course  Procedures (including critical care time) Labs Review Labs Reviewed - No data to display Imaging Review No results found.    MDM   Final diagnoses:  Insect bite of arm, right   The appearance of these particular lesions are insect bites, particularly similar to those of bed bugs.  Triamcinolone cream to apply to the right arm lesions. Use RidX spray at home and follow written instructions       Hayden Rasmussen, NP 03/08/13 1552

## 2013-03-08 NOTE — Discharge Instructions (Signed)
Bedbugs °Bedbugs are tiny bugs that live in and around beds. During the day, they hide in mattresses and other places near beds. They come out at night and bite people lying in bed. They need blood to live and grow. Bedbugs can be found in beds anywhere. Usually, they are found in places where many people come and go (hotels, shelters, hospitals). It does not matter whether the place is dirty or clean. °Getting bitten by bedbugs rarely causes a medical problem. The biggest problem can be getting rid of them.  This often takes the work of a pest control expert. °CAUSES °· Less use of pesticides. Bedbugs were common before the 1950s. Then, strong pesticides such as DDT nearly wiped them out. Today, these pesticides are not used because they harm the environment and can cause health problems. °· More travel. Besides mattresses, bedbugs can also live in clothing and luggage. They can come along as people travel from place to place. Bedbugs are more common in certain parts of the world. When people travel to those areas, the bugs can come home with them. °· Presence of birds and bats. Bedbugs often infest birds and bats. If you have these animals in or near your home, bedbugs may infest your house, too. °SYMPTOMS °It does not hurt to be bitten by a bedbug. You will probably not wake up when you are bitten. Bedbugs usually bite areas of the skin that are not covered. Symptoms may show when you wake up, or they may take a day or more to show up. Symptoms may include: °· Small red bumps on the skin. These might be lined up in a row or clustered in a group. °· A darker red dot in the middle of red bumps. °· Blisters on the skin. There may be swelling and very bad itching. These may be signs of an allergic reaction. This does not happen often. °DIAGNOSIS °Bedbug bites might look and feel like other types of insect bites. The bugs do not stay on the body like ticks or lice. They bite, drop off, and crawl away to hide. Your  caregiver will probably: °· Ask about your symptoms. °· Ask about your recent activities and travel. °· Check your skin for bedbug bites. °· Ask you to check at home for signs of bedbugs. You should look for: °· Spots or stains on the bed or nearby. This could be from bedbugs that were crushed or from their eggs or waste. °· Bedbugs themselves. They are reddish-brown, oval, and flat. They do not fly. They are about the size of an apple seed. °· Places to look for bedbugs include: °· Beds. Check mattresses, headboards, box springs, and bed frames. °· On drapes and curtains near the bed. °· Under carpeting in the bedroom. °· Behind electrical outlets. °· Behind any wallpaper that is peeling. °· Inside luggage. °TREATMENT °Most bedbug bites do not need treatment. They usually go away on their own in a few days. The bites are not dangerous. However, treatment may be needed if you have scratched so much that your skin has become infected. You may also need treatment if you are allergic to bedbug bites. Treatment options include: °· A drug that stops swelling and itching (corticosteroid). Usually, a cream is rubbed on the skin. If you have a bad rash, you may be given a corticosteroid pill. °· Oral antihistamines. These are pills to help control itching. °· Antibiotic medicines. An antibiotic may be prescribed for infected skin. °HOME CARE INSTRUCTIONS  °·   Take any medicine prescribed by your caregiver for your bites. Follow the directions carefully. °· Consider wearing pajamas with long sleeves and pant legs. °· Your bedroom may need to be treated. A pest control expert should make sure the bedbugs are gone. You may need to throw away mattresses or luggage. Ask the pest control expert what you can do to keep the bedbugs from coming back. Common suggestions include: °· Putting a plastic cover over your mattress. °· Washing and drying your clothes and bedding in hot water and a hot dryer. The temperature should be hotter  than 120° F (48.9° C). Bedbugs are killed by high temperatures. °· Vacuuming carefully all around your bed. Vacuum in all cracks and crevices where the bugs might hide. Do this often. °· Carefully checking all used furniture, bedding, or clothes that you bring into your house. °· Eliminating bird nests and bat roosts. °· If you get bedbug bites when traveling, check all your possessions carefully before bringing them into your house. If you find any bugs on clothes or in your luggage, consider throwing those items away. °SEEK MEDICAL CARE IF: °· You have red bug bites that keep coming back. °· You have red bug bites that itch badly. °· You have bug bites that cause a skin rash. °· You have scratch marks that are red and sore. °SEEK IMMEDIATE MEDICAL CARE IF: °You have a fever. °Document Released: 02/01/2010 Document Revised: 03/24/2011 Document Reviewed: 02/01/2010 °[employer]® Patient Information ©2014 [employer]. ° °Insect Bite °Mosquitoes, flies, fleas, bedbugs, and many other insects can bite. Insect bites are different from insect stings. A sting is when venom is injected into the skin. Some insect bites can transmit infectious diseases. °SYMPTOMS  °Insect bites usually turn red, swell, and itch for 2 to 4 days. They often go away on their own. °TREATMENT  °Your caregiver may prescribe antibiotic medicines if a bacterial infection develops in the bite. °HOME CARE INSTRUCTIONS °· Do not scratch the bite area. °· Keep the bite area clean and dry. Wash the bite area thoroughly with soap and water. °· Put ice or cool compresses on the bite area. °· Put ice in a plastic bag. °· Place a towel between your skin and the bag. °· Leave the ice on for 20 minutes, 4 times a day for the first 2 to 3 days, or as directed. °· You may apply a baking soda paste, cortisone cream, or calamine lotion to the bite area as directed by your caregiver. This can help reduce itching and swelling. °· Only take over-the-counter or  prescription medicines as directed by your caregiver. °· If you are given antibiotics, take them as directed. Finish them even if you start to feel better. °You may need a tetanus shot if: °· You cannot remember when you had your last tetanus shot. °· You have never had a tetanus shot. °· The injury broke your skin. °If you get a tetanus shot, your arm may swell, get red, and feel warm to the touch. This is common and not a problem. If you need a tetanus shot and you choose not to have one, there is a rare chance of getting tetanus. Sickness from tetanus can be serious. °SEEK IMMEDIATE MEDICAL CARE IF:  °· You have increased pain, redness, or swelling in the bite area. °· You see a red line on the skin coming from the bite. °· You have a fever. °· You have joint pain. °· You have a headache or neck   pain. °· You have unusual weakness. °· You have a rash. °· You have chest pain or shortness of breath. °· You have abdominal pain, nausea, or vomiting. °· You feel unusually tired or sleepy. °MAKE SURE YOU:  °· Understand these instructions. °· Will watch your condition. °· Will get help right away if you are not doing well or get worse. °Document Released: 02/07/2004 Document Revised: 03/24/2011 Document Reviewed: 07/31/2010 °ExitCare® Patient Information ©2014 [employer]. ° °

## 2013-03-09 NOTE — ED Provider Notes (Signed)
Medical screening examination/treatment/procedure(s) were performed by a resident physician or non-physician practitioner and as the supervising physician I was immediately available for consultation/collaboration.  Gabreille Dardis, MD    Chaney Maclaren S Alishia Lebo, MD 03/09/13 0745 

## 2013-09-15 ENCOUNTER — Emergency Department (HOSPITAL_COMMUNITY)
Admission: EM | Admit: 2013-09-15 | Discharge: 2013-09-15 | Disposition: A | Discharge status: Home / Self Care | Payer: PRIVATE HEALTH INSURANCE | Attending: Emergency Medicine | Admitting: Emergency Medicine

## 2013-09-15 ENCOUNTER — Encounter (HOSPITAL_COMMUNITY): Payer: Self-pay | Admitting: Emergency Medicine

## 2013-09-15 DIAGNOSIS — L259 Unspecified contact dermatitis, unspecified cause: Secondary | ICD-10-CM | POA: Insufficient documentation

## 2013-09-15 DIAGNOSIS — Z862 Personal history of diseases of the blood and blood-forming organs and certain disorders involving the immune mechanism: Secondary | ICD-10-CM | POA: Insufficient documentation

## 2013-09-15 DIAGNOSIS — E119 Type 2 diabetes mellitus without complications: Secondary | ICD-10-CM | POA: Diagnosis not present

## 2013-09-15 DIAGNOSIS — R21 Rash and other nonspecific skin eruption: Secondary | ICD-10-CM | POA: Insufficient documentation

## 2013-09-15 DIAGNOSIS — Z8719 Personal history of other diseases of the digestive system: Secondary | ICD-10-CM | POA: Diagnosis not present

## 2013-09-15 DIAGNOSIS — Z79899 Other long term (current) drug therapy: Secondary | ICD-10-CM | POA: Diagnosis not present

## 2013-09-15 NOTE — ED Provider Notes (Signed)
CSN: [id number]     Arrival date & time 09/15/13  0240 History   First MD Initiated Contact with Patient 09/15/13 0404     Chief Complaint  Patient presents with  . Rash     (Consider location/radiation/quality/duration/timing/severity/associated sxs/prior Treatment) HPI Comments: Patient is a 43 year old female who presents with a rash for the past 2 hours. The rash started gradually and progressively worsened since the onset. Patient reports waking up to burning and itching skin all over her body. The rash is located on all over her body. Patient has tried nothing without relief. Patient reports eating pimento cheese before bed last night but has never had a problem with it previously. Patient denies new exposures to medications, soaps, lotions, detergent. Patient reports associated occasional itching. No aggravating/alleviating factors. Patient denies fever, chills, NVD, sore throat, oral lesions, ocular involvement, throat closing, wheezing, SOB, chest pain, abdominal pain.     Patient is a 44 y.o. female presenting with rash.  Rash Associated symptoms: no abdominal pain, no diarrhea, no fatigue, no fever, no joint pain, no nausea, no shortness of breath and not vomiting     Past Medical History  Diagnosis Date  . Diabetes mellitus   . Gallstones   . Anemia    Past Surgical History  Procedure Laterality Date  . Urethral diverticulum repair    . Cesarean section      x 2  . Cholecystectomy  08/23/2011    Procedure: LAPAROSCOPIC CHOLECYSTECTOMY WITH INTRAOPERATIVE CHOLANGIOGRAM;  Surgeon: Adolph Pollack, MD;  Location: [facility];  Service: General;  Laterality: N/A;   Family History  Problem Relation Age of Onset  . Other Neg Hx   . Cancer Maternal Aunt     breast  . Diabetes Maternal Grandmother    History  Substance Use Topics  . Smoking status: Never Smoker   . Smokeless tobacco: Not on file  . Alcohol Use: No   OB History   Grav Para Term Preterm Abortions TAB SAB  Ect Mult Living   Review of Systems  Constitutional: Negative for fever, chills and fatigue.  HENT: Negative for trouble swallowing.   Eyes: Negative for visual disturbance.  Respiratory: Negative for shortness of breath.   Cardiovascular: Negative for chest pain and palpitations.  Gastrointestinal: Negative for nausea, vomiting, abdominal pain and diarrhea.  Genitourinary: Negative for dysuria and difficulty urinating.  Musculoskeletal: Negative for arthralgias and neck pain.  Skin: Positive for rash. Negative for color change.  Neurological: Negative for dizziness and weakness.  Psychiatric/Behavioral: Negative for dysphoric mood.      Allergies  Sulfamethoxazole-trimethoprim and Sulfonamide derivatives  Home Medications   Prior to Admission medications   Medication Sig Start Date End Date Taking? Authorizing Provider  ferrous sulfate 325 (65 FE) MG tablet Take 325 mg by mouth daily with breakfast.     Yes Historical Provider, MD  metFORMIN (GLUCOPHAGE) 500 MG tablet Take 500 mg by mouth daily with breakfast.   Yes Historical Provider, MD   BP 137/84  Pulse 93  Temp(Src) 98.2 F (36.8 C) (Oral)  Resp 18  Ht  (1.753 m)  Wt 208 lb (94.348 kg)  BMI 30.70 kg/m2  SpO2 100%  LMP 09/12/2013 Physical Exam  Nursing note and vitals reviewed. Constitutional: She is oriented to person, place, and time. She appears well-developed and well-nourished. No distress.  HENT:  Head: Normocephalic and atraumatic.  Eyes:  Conjunctivae and EOM are normal.  Neck: Normal range of motion.  Cardiovascular: Normal rate and regular rhythm.  Exam reveals no gallop and no friction rub.   No murmur heard. Pulmonary/Chest: Effort normal and breath sounds normal. She has no wheezes. She has no rales. She exhibits no tenderness.  Musculoskeletal: Normal range of motion.  Neurological: She is alert and oriented to person, place, and time. Coordination normal.  Speech is  goal-oriented. Moves limbs without ataxia.   Skin: Skin is warm and dry.  Mild erythema to left arm. No wound.   Psychiatric: She has a normal mood and affect. Her behavior is normal.    ED Course  Procedures (including critical care time) Labs Review Labs Reviewed - No data to display  Imaging Review No results found.   EKG Interpretation None      MDM   Final diagnoses:  Contact dermatitis    4:10 AM Patient's rash has resolved since being in the ED. She is asymptomatic at this time. Vitals stable and patient afebrile.    Emilia Beck, PA-C 09/15/13 [phone]

## 2013-09-15 NOTE — ED Notes (Signed)
PT states that when she went to bed this pm she was fine but woke up around 1:30am with skin "burning" and itching; pt with red raised rash to arms, legs and abd; pt denies any resp distress or difficulty.

## 2013-09-15 NOTE — Discharge Instructions (Signed)
Take benadryl as needed. Return to the ED with worsening or concerning symptoms.

## 2013-09-15 NOTE — ED Provider Notes (Signed)
Medical screening examination/treatment/procedure(s) were performed by non-physician practitioner and as supervising physician I was immediately available for consultation/collaboration.   EKG Interpretation None       Peirce Deveney M Geovani Tootle, MD 09/15/13 0657 

## 2013-11-14 ENCOUNTER — Encounter (HOSPITAL_COMMUNITY): Payer: Self-pay | Admitting: Emergency Medicine

## 2013-11-18 IMAGING — CR DG CHEST 2V
2 series · 2 of 2 positions shown · non-contrast
Comparison: 11/27/2008.

CLINICAL DATA: Chest pain.  Abdominal pain.

CHEST - 2 VIEW

[w chest pa]
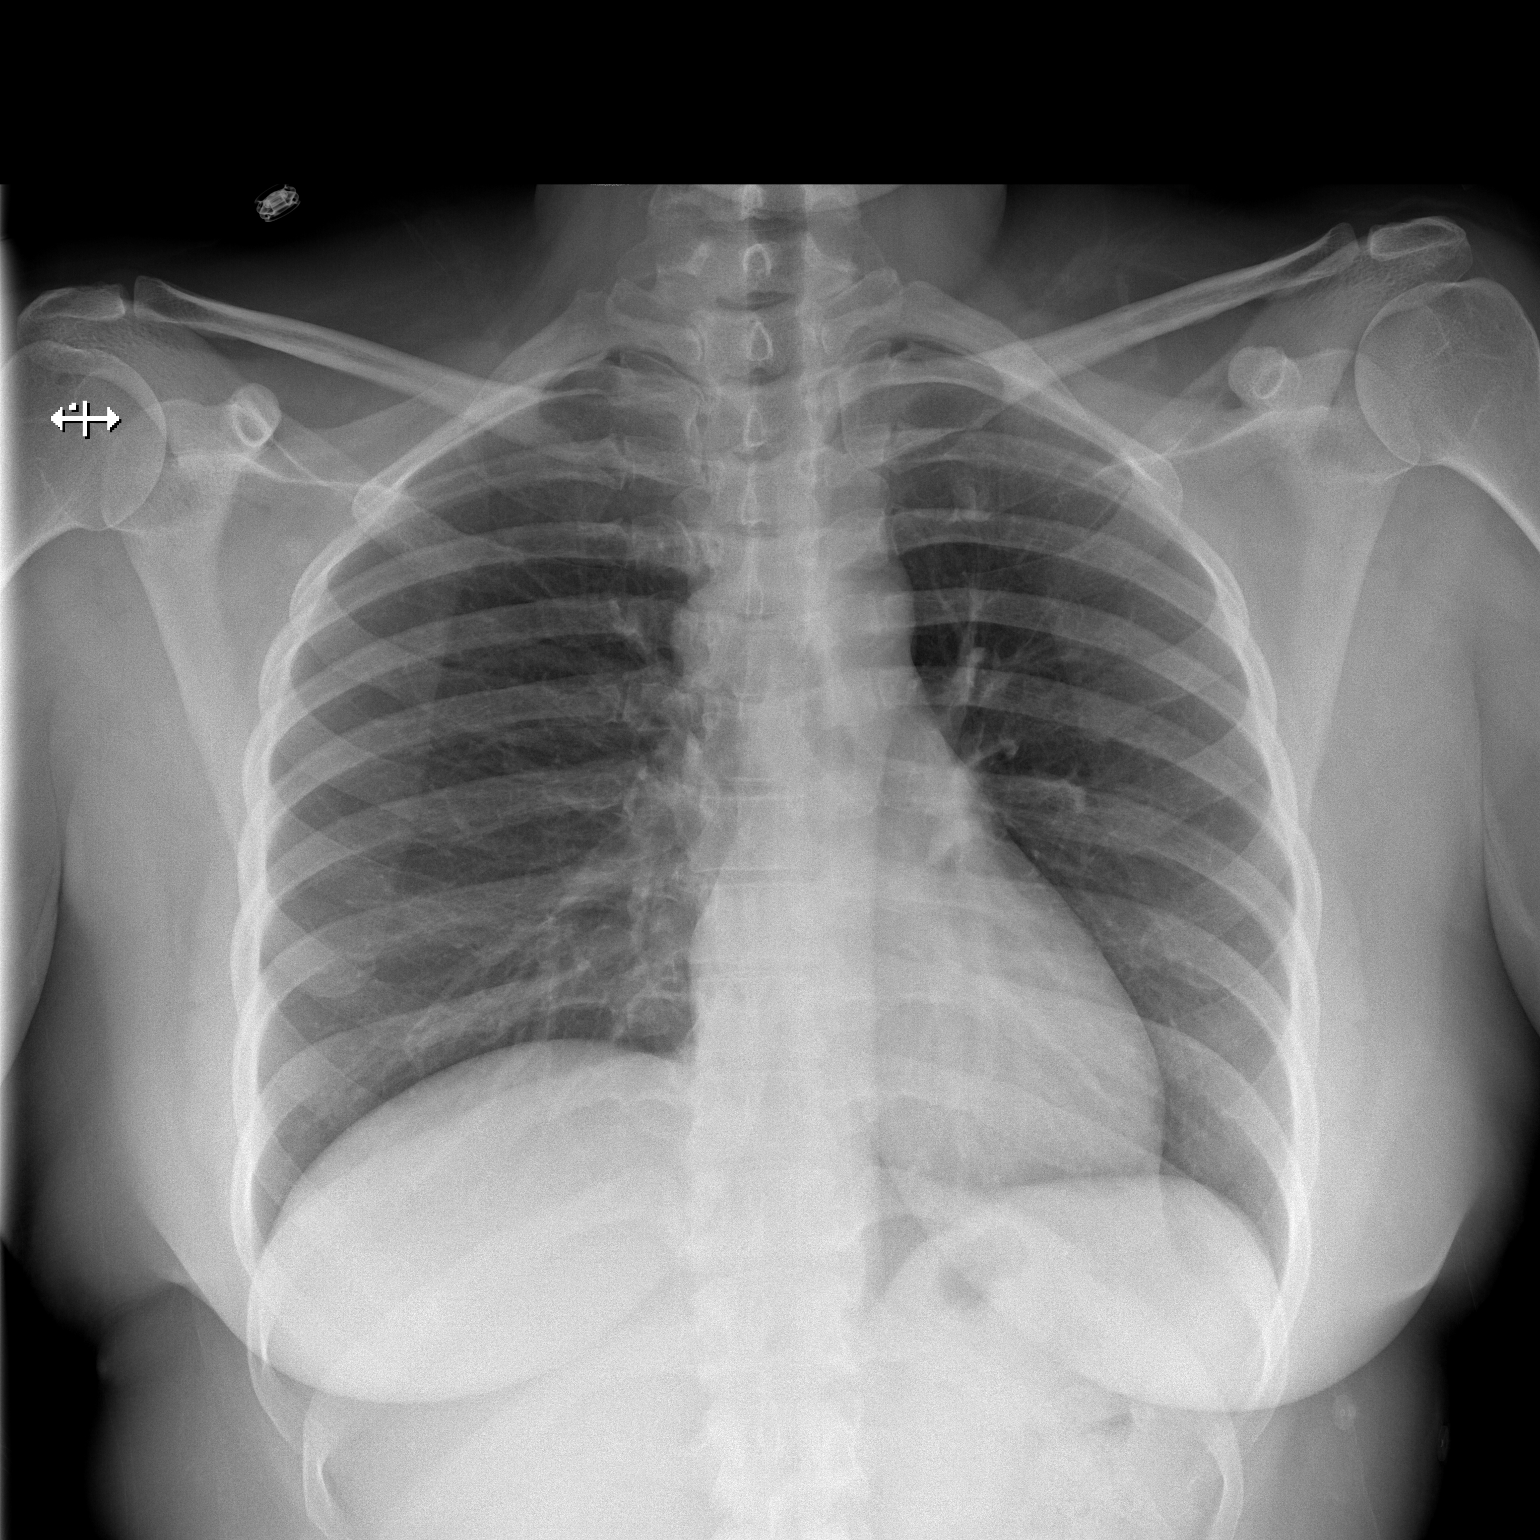

[w chest lat]
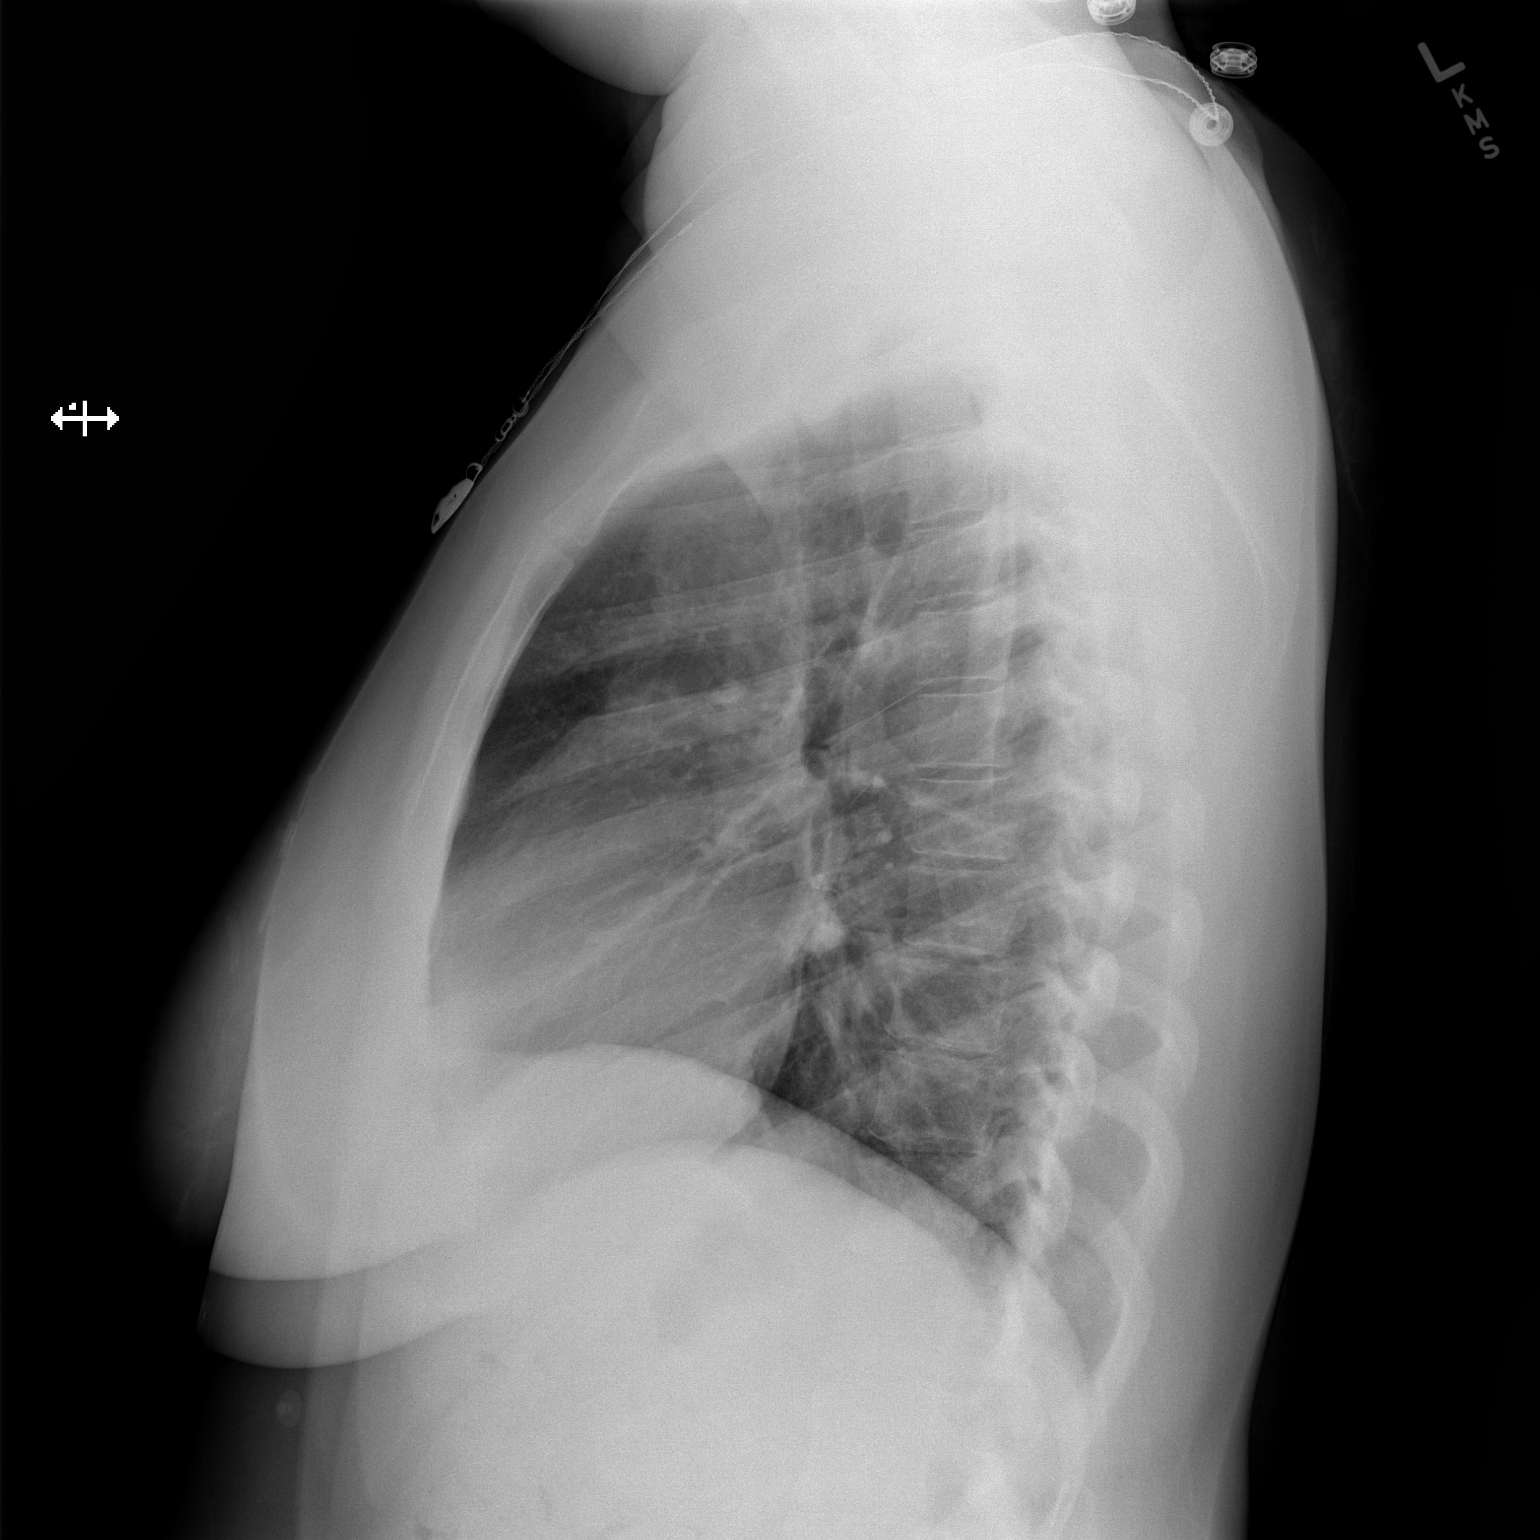

[2 of 2 positions shown; findings below may reference images not displayed]

FINDINGS: Cardiopericardial silhouette within normal limits.
Mediastinal contours normal. Trachea midline.  No airspace disease
or effusion.
IMPRESSION: Negative two-view chest.

## 2014-05-03 ENCOUNTER — Encounter (HOSPITAL_BASED_OUTPATIENT_CLINIC_OR_DEPARTMENT_OTHER): Payer: Self-pay

## 2014-05-03 ENCOUNTER — Emergency Department (HOSPITAL_BASED_OUTPATIENT_CLINIC_OR_DEPARTMENT_OTHER)
Admission: EM | Admit: 2014-05-03 | Discharge: 2014-05-04 | Disposition: A | Discharge status: Home / Self Care | Payer: PRIVATE HEALTH INSURANCE | Attending: Emergency Medicine | Admitting: Emergency Medicine

## 2014-05-03 ENCOUNTER — Emergency Department (HOSPITAL_BASED_OUTPATIENT_CLINIC_OR_DEPARTMENT_OTHER): Payer: PRIVATE HEALTH INSURANCE

## 2014-05-03 DIAGNOSIS — D649 Anemia, unspecified: Secondary | ICD-10-CM | POA: Diagnosis not present

## 2014-05-03 DIAGNOSIS — Z8719 Personal history of other diseases of the digestive system: Secondary | ICD-10-CM | POA: Insufficient documentation

## 2014-05-03 DIAGNOSIS — R531 Weakness: Secondary | ICD-10-CM | POA: Insufficient documentation

## 2014-05-03 DIAGNOSIS — M542 Cervicalgia: Secondary | ICD-10-CM | POA: Diagnosis not present

## 2014-05-03 DIAGNOSIS — Z79899 Other long term (current) drug therapy: Secondary | ICD-10-CM | POA: Insufficient documentation

## 2014-05-03 DIAGNOSIS — R51 Headache: Secondary | ICD-10-CM | POA: Insufficient documentation

## 2014-05-03 DIAGNOSIS — R079 Chest pain, unspecified: Secondary | ICD-10-CM

## 2014-05-03 LAB — CBC WITH DIFFERENTIAL/PLATELET
BASOS ABS: 0 10*3/uL (ref 0.0–0.1)
BASOS PCT: 0 % (ref 0–1)
EOS ABS: 0.1 10*3/uL (ref 0.0–0.7)
EOS PCT: 2 % (ref 0–5)
HEMATOCRIT: 38.3 % (ref 36.0–46.0)
Hemoglobin: 12.1 g/dL (ref 12.0–15.0)
Lymphocytes Relative: 46 % (ref 12–46)
Lymphs Abs: 2.2 10*3/uL (ref 0.7–4.0)
MCH: 25.7 pg — AB (ref 26.0–34.0)
MCHC: 31.6 g/dL (ref 30.0–36.0)
MCV: 81.5 fL (ref 78.0–100.0)
Monocytes Absolute: 0.4 10*3/uL (ref 0.1–1.0)
Monocytes Relative: 8 % (ref 3–12)
NEUTROS PCT: 44 % (ref 43–77)
Neutro Abs: 2.1 10*3/uL (ref 1.7–7.7)
Platelets: 266 10*3/uL (ref 150–400)
RBC: 4.7 MIL/uL (ref 3.87–5.11)
RDW: 14.2 % (ref 11.5–15.5)
WBC: 4.8 10*3/uL (ref 4.0–10.5)

## 2014-05-03 LAB — TROPONIN I

## 2014-05-03 LAB — COMPREHENSIVE METABOLIC PANEL
ALBUMIN: 3.9 g/dL (ref 3.5–5.2)
ALK PHOS: 54 U/L (ref 39–117)
ALT: 17 U/L (ref 0–35)
AST: 21 U/L (ref 0–37)
Anion gap: 5 (ref 5–15)
BUN: 10 mg/dL (ref 6–23)
CALCIUM: 8.9 mg/dL (ref 8.4–10.5)
CO2: 29 mmol/L (ref 19–32)
Chloride: 102 mmol/L (ref 96–112)
Creatinine, Ser: 0.62 mg/dL (ref 0.50–1.10)
GFR calc Af Amer: >90 mL/min (ref >90)
GFR calc non Af Amer: >90 mL/min (ref >90)
Glucose, Bld: 108 mg/dL — ABNORMAL HIGH (ref 70–99)
POTASSIUM: 3.6 mmol/L (ref 3.5–5.1)
SODIUM: 136 mmol/L (ref 135–145)
TOTAL PROTEIN: 8.3 g/dL (ref 6.0–8.3)
Total Bilirubin: 0.7 mg/dL (ref 0.3–1.2)

## 2014-05-03 LAB — CBG MONITORING, ED: GLUCOSE-CAPILLARY: 96 mg/dL (ref 70–99)

## 2014-05-03 MED ORDER — ACETAMINOPHEN 325 MG PO TABS
650.0000 mg | ORAL_TABLET | Freq: Once | ORAL | Status: AC
  Administered 2014-05-03: 650 mg via ORAL
  Filled 2014-05-03: qty 2

## 2014-05-03 NOTE — ED Provider Notes (Signed)
CSN: [id number]     Arrival date & time 05/03/14  1942 History  This chart was scribed for Tilden Fossa, MD by Phillis Haggis, ED Scribe. This patient was seen in room MH03/MH03 and patient care was started at 9:08 PM.     Chief Complaint  Patient presents with  . Chest Pain   Patient is a 44 y.o. female presenting with chest pain. The history is provided by the patient. No language interpreter was used.  Chest Pain Pain location:  L chest Pain quality: aching   Pain radiates to:  Neck and L arm Pain radiates to the back: no   Pain severity:  Moderate Duration:  2 hours Timing:  Constant Progression:  Waxing and waning Chronicity:  New Ineffective treatments:  None tried Associated symptoms: headache and weakness   Associated symptoms: no abdominal pain, no fever and no shortness of breath     HPI Comments: Belinda Stewart is a 44 y.o. female with a history of diabetes and anemia who presents to the Emergency Department complaining of upper left chest pain onset two hours ago. She reports the pain as waxing and waning and aching. She reports associated headache with pain to the left side of her neck and left arm with weakness. She states that she was in class when her chest started hurting. She states that her arm feels heavy. Patient denies SOB, fever,abdominal pain. She reports leg pain from being on her feet all day at work. She states that she has had similar symptoms in the past but was never diagnosed. She reports family history of stroke. She denies having any other medical problems. Patient denies smoking. Patient denies chance of pregnancy.   Past Medical History  Diagnosis Date  . Diabetes mellitus   . Gallstones   . Anemia    Past Surgical History  Procedure Laterality Date  . Urethral diverticulum repair    . Cesarean section      x 2  . Cholecystectomy  08/23/2011    Procedure: LAPAROSCOPIC CHOLECYSTECTOMY WITH INTRAOPERATIVE CHOLANGIOGRAM;  Surgeon: Adolph Pollack, MD;  Location: [facility];  Service: General;  Laterality: N/A;   Family History  Problem Relation Age of Onset  . Other Neg Hx   . Cancer Maternal Aunt     breast  . Diabetes Maternal Grandmother    History  Substance Use Topics  . Smoking status: Never Smoker   . Smokeless tobacco: Not on file  . Alcohol Use: No   OB History    Gravida Para Term Preterm AB TAB SAB Ectopic Multiple Living   Review of Systems  Constitutional: Negative for fever.  Respiratory: Negative for shortness of breath.   Cardiovascular: Positive for chest pain. Negative for leg swelling.  Gastrointestinal: Negative for abdominal pain.  Musculoskeletal: Positive for neck pain.  Neurological: Positive for weakness and headaches.   Allergies  Sulfamethoxazole-trimethoprim and Sulfonamide derivatives  Home Medications   Prior to Admission medications   Medication Sig Start Date End Date Taking? Authorizing Provider  ferrous sulfate 325 (65 FE) MG tablet Take 325 mg by mouth daily with breakfast.      Historical Provider, MD  metFORMIN (GLUCOPHAGE) 500 MG tablet Take 500 mg by mouth daily with breakfast.    Historical Provider, MD   BP 128/87 mmHg  Pulse 84  Temp(Src) 98.3 F (36.8 C) (Oral)  Resp 12  Ht  (  1.753 m)  Wt 214 lb (97.07 kg)  BMI 31.59 kg/m2  SpO2 100%  LMP 05/01/2014   Physical Exam  Constitutional: She is oriented to person, place, and time. She appears well-developed and well-nourished.  HENT:  Head: Normocephalic and atraumatic.  Eyes: Pupils are equal, round, and reactive to light.  Cardiovascular: Normal rate and regular rhythm.   No murmur heard. Pulmonary/Chest: Effort normal and breath sounds normal. No respiratory distress. She exhibits no tenderness.  Abdominal: Soft. There is no tenderness. There is no rebound and no guarding.  Musculoskeletal: She exhibits no edema or tenderness.  2+ radial pulses bilaterally. 5/5 strength   Neurological: She is alert and oriented to person, place, and time. No cranial nerve deficit.  5/5 strength in all four extremities.  Sensation to light touch intact in all four extremities.   Skin: Skin is warm and dry.  Psychiatric: She has a normal mood and affect. Her behavior is normal.  Nursing note and vitals reviewed.   ED Course  Procedures (including critical care time) DIAGNOSTIC STUDIES: Oxygen Saturation is 100% on room air, normal by my interpretation.    COORDINATION OF CARE: 9:13 PM-Discussed treatment plan which includes labs and pain medication with pt at bedside and pt agreed to plan.   Labs Review Labs Reviewed  CBC WITH DIFFERENTIAL/PLATELET - Abnormal; Notable for the following:    MCH 25.7 (*)    All other components within normal limits  COMPREHENSIVE METABOLIC PANEL - Abnormal; Notable for the following:    Glucose, Bld 108 (*)    All other components within normal limits  TROPONIN I  TROPONIN I  CBG MONITORING, ED   Imaging Review Dg Chest 2 View  05/03/2014   CLINICAL DATA:  Chest pain for 2 hours  EXAM: CHEST  2 VIEW  COMPARISON:  12/21/2010  FINDINGS: The heart size and mediastinal contours are within normal limits. Both lungs are clear. The visualized skeletal structures are unremarkable.  IMPRESSION: No active cardiopulmonary disease.   Electronically Signed   By: Alcide CleverMark  Lukens M.D.   On: 05/03/2014 21:59     EKG Interpretation   Date/Time:  Wednesday May 03 2014 19:50:01 EDT Ventricular Rate:  91 PR Interval:  198 QRS Duration: 72 QT Interval:  338 QTC Calculation: 415 R Axis:   83 Text Interpretation:  Normal sinus rhythm Normal ECG Confirmed by Lincoln Brighamees,  Liz [phone] on 05/03/2014 8:51:58 PM      MDM   Final diagnoses:  Chest pain, unspecified chest pain type    Patient here for evaluation of headache and chest pain. History and presentation is not consistent with subarachnoid hemorrhage, dissection, ACS, PE. Patient is  neurologically intact on exam. Patient improved on recheck after Tylenol. Discussed PCP follow-up as well as return precautions.  I personally performed the services described in this documentation, which was scribed in my presence. The recorded information has been reviewed and is accurate.    Tilden FossaElizabeth Tuyen Uncapher, MD 05/04/14 [phone]

## 2014-05-03 NOTE — ED Notes (Addendum)
Pt reports headache, chest pain, left arm weakness and neck pain that started 30 mins pta.  Denies n/v.  Denies SOB. Equal hand grips.  No pronator drift noted. No facial droop

## 2014-05-03 NOTE — ED Notes (Signed)
C/o "not feeling well", lists CP, HA, neck pain, some recent dizziness and sob, (denies: nvd, fever), "HA constant, other sx intermittent", rates pain 5/10. Alert, NAD, calm, interactive, resps e/u, speaking in clear complete sentences, VSS, skin W&D.

## 2014-05-04 MED ORDER — KETOROLAC TROMETHAMINE 15 MG/ML IJ SOLN
15.0000 mg | Freq: Once | INTRAMUSCULAR | Status: AC
  Administered 2014-05-04: 15 mg via INTRAVENOUS
  Filled 2014-05-04: qty 1

## 2014-05-04 NOTE — ED Notes (Signed)
MD at bedside discussing test results and dispo plan of care. 

## 2014-05-04 NOTE — Discharge Instructions (Signed)

## 2014-08-02 ENCOUNTER — Other Ambulatory Visit: Payer: Self-pay | Admitting: Obstetrics and Gynecology

## 2015-01-26 ENCOUNTER — Encounter (HOSPITAL_COMMUNITY): Payer: Self-pay | Admitting: Emergency Medicine

## 2015-01-26 ENCOUNTER — Emergency Department (HOSPITAL_COMMUNITY)
Admission: EM | Admit: 2015-01-26 | Discharge: 2015-01-27 | Disposition: A | Discharge status: Home / Self Care | Payer: PRIVATE HEALTH INSURANCE | Attending: Emergency Medicine | Admitting: Emergency Medicine

## 2015-01-26 DIAGNOSIS — Z7984 Long term (current) use of oral hypoglycemic drugs: Secondary | ICD-10-CM | POA: Diagnosis not present

## 2015-01-26 DIAGNOSIS — Z862 Personal history of diseases of the blood and blood-forming organs and certain disorders involving the immune mechanism: Secondary | ICD-10-CM | POA: Insufficient documentation

## 2015-01-26 DIAGNOSIS — N12 Tubulo-interstitial nephritis, not specified as acute or chronic: Secondary | ICD-10-CM | POA: Insufficient documentation

## 2015-01-26 DIAGNOSIS — R109 Unspecified abdominal pain: Secondary | ICD-10-CM

## 2015-01-26 DIAGNOSIS — E119 Type 2 diabetes mellitus without complications: Secondary | ICD-10-CM | POA: Insufficient documentation

## 2015-01-26 DIAGNOSIS — Z9889 Other specified postprocedural states: Secondary | ICD-10-CM | POA: Diagnosis not present

## 2015-01-26 DIAGNOSIS — Z3202 Encounter for pregnancy test, result negative: Secondary | ICD-10-CM | POA: Insufficient documentation

## 2015-01-26 DIAGNOSIS — Z9049 Acquired absence of other specified parts of digestive tract: Secondary | ICD-10-CM | POA: Insufficient documentation

## 2015-01-26 DIAGNOSIS — Z8719 Personal history of other diseases of the digestive system: Secondary | ICD-10-CM | POA: Diagnosis not present

## 2015-01-26 LAB — URINALYSIS, ROUTINE W REFLEX MICROSCOPIC
Bilirubin Urine: NEGATIVE
GLUCOSE, UA: NEGATIVE mg/dL
Hgb urine dipstick: NEGATIVE
Ketones, ur: NEGATIVE mg/dL
NITRITE: NEGATIVE
PH: 6 (ref 5.0–8.0)
Protein, ur: NEGATIVE mg/dL
SPECIFIC GRAVITY, URINE: 1.025 (ref 1.005–1.030)

## 2015-01-26 LAB — URINE MICROSCOPIC-ADD ON: RBC / HPF: NONE SEEN RBC/hpf (ref 0–5)

## 2015-01-26 NOTE — ED Notes (Signed)
Patient c/o right flank pain x2 days, urinary frequency, dark urine, fever of 99.2 at home, nausea. History of DM.

## 2015-01-27 ENCOUNTER — Emergency Department (HOSPITAL_COMMUNITY): Payer: PRIVATE HEALTH INSURANCE

## 2015-01-27 LAB — PREGNANCY, URINE: Preg Test, Ur: NEGATIVE

## 2015-01-27 MED ORDER — CEPHALEXIN 500 MG PO CAPS
500.0000 mg | ORAL_CAPSULE | Freq: Once | ORAL | Status: AC
  Administered 2015-01-27: 500 mg via ORAL
  Filled 2015-01-27: qty 1

## 2015-01-27 MED ORDER — OXYCODONE-ACETAMINOPHEN 5-325 MG PO TABS
1.0000 | ORAL_TABLET | Freq: Once | ORAL | Status: AC
  Administered 2015-01-27: 1 via ORAL
  Filled 2015-01-27: qty 1

## 2015-01-27 MED ORDER — OXYCODONE-ACETAMINOPHEN 5-325 MG PO TABS: ORAL_TABLET | ORAL | Status: AC

## 2015-01-27 MED ORDER — ONDANSETRON 4 MG PO TBDP
4.0000 mg | ORAL_TABLET | Freq: Once | ORAL | Status: AC
  Administered 2015-01-27: 4 mg via ORAL
  Filled 2015-01-27: qty 1

## 2015-01-27 MED ORDER — CEPHALEXIN 500 MG PO CAPS: 500.0000 mg | ORAL_CAPSULE | Freq: Three times a day (TID) | ORAL | Status: DC

## 2015-01-27 NOTE — Discharge Instructions (Signed)
For pain control please take ibuprofen (also known as Motrin or Advil) 800mg  (this is normally 4 over the counter pills) 3 times a day  for 5 days. Take with food to minimize stomach irritation.  Take percocet for breakthrough pain, do not drink alcohol, drive, care for children or do other critical tasks while taking percocet.  Your CAT scan today showed an abnormality in the liver. Please let your primary care doctor known you were seen in the ED, they may choose to further image this abnormality.  Please follow with your primary care doctor in the next 2 days for a check-up. They must obtain records for further management.   Do not hesitate to return to the Emergency Department for any new, worsening or concerning symptoms.    Pyelonephritis, Adult Pyelonephritis is a kidney infection. The kidneys are organs that help clean your blood by moving waste out of your blood and into your pee (urine). This infection can happen quickly, or it can last for a long time. In most cases, it clears up with treatment and does not cause other problems. HOME CARE Medicines  Take over-the-counter and prescription medicines only as told by your doctor.  Take your antibiotic medicine as told by your doctor. Do not stop taking the medicine even if you start to feel better. General Instructions  Drink enough fluid to keep your pee clear or pale yellow.  Avoid caffeine, tea, and carbonated drinks.  Pee (urinate) often. Avoid holding in pee for long periods of time.  Pee before and after sex.  After pooping (having a bowel movement), women should wipe from front to back. Use each tissue only once.  Keep all follow-up visits as told by your doctor. This is important. GET HELP IF:  You do not feel better after 2 days.  Your symptoms get worse.  You have a fever. GET HELP RIGHT AWAY IF:  You cannot take your medicine or drink fluids as told.  You have chills and shaking.  You throw up  (vomit).  You have very bad pain in your side (flank) or back.  You feel very weak or you pass out (faint).   This information is not intended to replace advice given to you by your health care provider. Make sure you discuss any questions you have with your health care provider.   Document Released: 02/07/2004 Document Revised: 09/20/2014 Document Reviewed: 04/24/2014 Elsevier Interactive Patient Education [employer]2016 Elsevier Inc.

## 2015-01-27 NOTE — ED Provider Notes (Signed)
CSN: [id number]     Arrival date & time 01/26/15  2050 History   First MD Initiated Contact with Patient 01/27/15 0021     Chief Complaint  Patient presents with  . Flank Pain     (Consider location/radiation/quality/duration/timing/severity/associated sxs/prior Treatment) HPI   Blood pressure 126/92, pulse 88, temperature 98 F (36.7 C), temperature source Oral, resp. rate 20, SpO2 100 %.  Belinda Stewart is a 45 y.o. female with past medical history significant for non-insulin-dependent diabetes complaining of right flank pain onset 2 days ago with urinary frequency, concentrated urine and low-grade fever at home. She has no history of kidney stone she denies dysuria, hematuria, emesis, abdominal pain, change in bowel or bladder habits. Sugar has been 152.   Past Medical History  Diagnosis Date  . Diabetes mellitus   . Gallstones   . Anemia    Past Surgical History  Procedure Laterality Date  . Urethral diverticulum repair    . Cesarean section      x 2  . Cholecystectomy  08/23/2011    Procedure: LAPAROSCOPIC CHOLECYSTECTOMY WITH INTRAOPERATIVE CHOLANGIOGRAM;  Surgeon: Adolph Pollackodd J Rosenbower, MD;  Location: [facility];  Service: General;  Laterality: N/A;   Family History  Problem Relation Age of Onset  . Other Neg Hx   . Cancer Maternal Aunt     breast  . Diabetes Maternal Grandmother    Social History  Substance Use Topics  . Smoking status: Never Smoker   . Smokeless tobacco: None  . Alcohol Use: No   OB History    Gravida Para Term Preterm AB TAB SAB Ectopic Multiple Living   3 3        3      Review of Systems  10 systems reviewed and found to be negative, except as noted in the HPI.   Allergies  Bee venom; Sulfamethoxazole-trimethoprim; and Sulfonamide derivatives  Home Medications   Prior to Admission medications   Medication Sig Start Date End Date Taking? Authorizing Provider  metFORMIN (GLUCOPHAGE) 500 MG tablet Take 500 mg by mouth daily with  breakfast.   Yes Historical Provider, MD   BP 126/92 mmHg  Pulse 88  Temp(Src) 98 F (36.7 C) (Oral)  Resp 20  SpO2 100% Physical Exam  Constitutional: She is oriented to person, place, and time. She appears well-developed and well-nourished. No distress.  HENT:  Head: Normocephalic and atraumatic.  Mouth/Throat: Oropharynx is clear and moist.  Eyes: Conjunctivae and EOM are normal. Pupils are equal, round, and reactive to light.  Neck: Normal range of motion.  Cardiovascular: Normal rate, regular rhythm and intact distal pulses.   Pulmonary/Chest: Effort normal and breath sounds normal. No stridor.  Abdominal: Soft. Bowel sounds are normal. She exhibits no distension and no mass. There is no tenderness. There is no rebound and no guarding.  Genitourinary:  Mild right CVA tenderness to percussion  Musculoskeletal: Normal range of motion.  Neurological: She is alert and oriented to person, place, and time.  Skin: She is not diaphoretic.  Psychiatric: She has a normal mood and affect.  Nursing note and vitals reviewed.   ED Course  Procedures (including critical care time) Labs Review Labs Reviewed  URINALYSIS, ROUTINE W REFLEX MICROSCOPIC (NOT AT [facility]) - Abnormal; Notable for the following:    APPearance CLOUDY (*)    Leukocytes, UA TRACE (*)    All other components within normal limits  URINE MICROSCOPIC-ADD ON - Abnormal; Notable for the following:    Squamous Epithelial /  LPF 0-5 (*)    Bacteria, UA RARE (*)    Crystals CA OXALATE CRYSTALS (*)    All other components within normal limits    Imaging Review No results found. I have personally reviewed and evaluated these images and lab results as part of my medical decision-making.   EKG Interpretation None      MDM   Final diagnoses:  Acute right flank pain    Filed Vitals:   01/26/15 2105  BP: 126/92  Pulse: 88  Temp: 98 F (36.7 C)  TempSrc: Oral  Resp: 20  SpO2: 100%    Medications   oxyCODONE-acetaminophen (PERCOCET/ROXICET) 5-325 MG per tablet 1 tablet (1 tablet Oral Given 01/27/15 0123)  ondansetron (ZOFRAN-ODT) disintegrating tablet 4 mg (4 mg Oral Given 01/27/15 0123)  cephALEXin (KEFLEX) capsule 500 mg (500 mg Oral Given 01/27/15 0411)    Belinda Stewart is 45 y.o. female presenting with right flank pain worsening over the course of 2 days. She has low-grade fever, non-insulin-dependent diabetic. Physical exam with very mild right CVA tenderness to percussion. Urinalysis with calcium oxalate  crystals. Will CT to evaluate for stone.  CT did not reveal stone, they do note an abnormality in the liver. Reviewed reviewed with attending who agrees that patient is safe for discharge to home, can be followed up with primary care. I've splint his patient that her primary care physician will have to obtain records and decide what further imaging timing. Patient was started on Keflex for pyelonephritis. We've had an extensive discussion of return precautions and patient verbalizes her understanding.  Evaluation does not show pathology that would require ongoing emergent intervention or inpatient treatment. Pt is hemodynamically stable and mentating appropriately. Discussed findings and plan with patient/guardian, who agrees with care plan. All questions answered. Return precautions discussed and outpatient follow up given.   Discharge Medication List as of 01/27/2015  4:07 AM    START taking these medications   Details  cephALEXin (KEFLEX) 500 MG capsule Take 1 capsule (500 mg total) by mouth 3 (three) times daily., Starting 01/27/2015, Until Discontinued, Print    oxyCODONE-acetaminophen (PERCOCET/ROXICET) 5-325 MG tablet 1 to 2 tabs PO q6hrs  PRN for pain, Print             Wynetta Emery, PA-C 01/27/15 0422  Lyndal Pulley, MD 01/27/15 [phone]

## 2015-12-26 ENCOUNTER — Other Ambulatory Visit: Payer: Self-pay | Admitting: Obstetrics and Gynecology

## 2015-12-26 DIAGNOSIS — N644 Mastodynia: Secondary | ICD-10-CM

## 2016-01-08 ENCOUNTER — Ambulatory Visit
Admission: RE | Admit: 2016-01-08 | Discharge: 2016-01-08 | Disposition: A | Discharge status: Home / Self Care | Payer: PRIVATE HEALTH INSURANCE | Source: Ambulatory Visit | Attending: Obstetrics and Gynecology | Admitting: Obstetrics and Gynecology

## 2016-01-08 DIAGNOSIS — N644 Mastodynia: Secondary | ICD-10-CM

## 2017-09-18 ENCOUNTER — Emergency Department (HOSPITAL_BASED_OUTPATIENT_CLINIC_OR_DEPARTMENT_OTHER)
Admission: EM | Admit: 2017-09-18 | Discharge: 2017-09-18 | Disposition: A | Discharge status: Home / Self Care | Payer: BLUE CROSS/BLUE SHIELD | Attending: Emergency Medicine | Admitting: Emergency Medicine

## 2017-09-18 ENCOUNTER — Encounter (HOSPITAL_BASED_OUTPATIENT_CLINIC_OR_DEPARTMENT_OTHER): Payer: Self-pay | Admitting: *Deleted

## 2017-09-18 ENCOUNTER — Other Ambulatory Visit: Payer: Self-pay

## 2017-09-18 DIAGNOSIS — Z7984 Long term (current) use of oral hypoglycemic drugs: Secondary | ICD-10-CM | POA: Diagnosis not present

## 2017-09-18 DIAGNOSIS — E119 Type 2 diabetes mellitus without complications: Secondary | ICD-10-CM | POA: Diagnosis not present

## 2017-09-18 DIAGNOSIS — Z79899 Other long term (current) drug therapy: Secondary | ICD-10-CM | POA: Diagnosis not present

## 2017-09-18 DIAGNOSIS — Z9049 Acquired absence of other specified parts of digestive tract: Secondary | ICD-10-CM | POA: Diagnosis not present

## 2017-09-18 DIAGNOSIS — R51 Headache: Secondary | ICD-10-CM | POA: Insufficient documentation

## 2017-09-18 DIAGNOSIS — R519 Headache, unspecified: Secondary | ICD-10-CM

## 2017-09-18 DIAGNOSIS — M542 Cervicalgia: Secondary | ICD-10-CM | POA: Diagnosis present

## 2017-09-18 MED ORDER — IBUPROFEN 800 MG PO TABS
800.0000 mg | ORAL_TABLET | Freq: Once | ORAL | Status: AC
  Administered 2017-09-18: 800 mg via ORAL
  Filled 2017-09-18: qty 1
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
## 2020-06-06 ENCOUNTER — Other Ambulatory Visit: Payer: Self-pay

## 2020-06-06 ENCOUNTER — Encounter (HOSPITAL_COMMUNITY): Payer: Self-pay

## 2020-06-06 ENCOUNTER — Emergency Department (HOSPITAL_COMMUNITY): Payer: Self-pay

## 2020-06-06 ENCOUNTER — Emergency Department (HOSPITAL_COMMUNITY)
Admission: EM | Admit: 2020-06-06 | Discharge: 2020-06-06 | Disposition: A | Discharge status: Home / Self Care | Payer: Self-pay | Attending: Emergency Medicine | Admitting: Emergency Medicine

## 2020-06-06 DIAGNOSIS — E876 Hypokalemia: Secondary | ICD-10-CM | POA: Insufficient documentation

## 2020-06-06 DIAGNOSIS — R0602 Shortness of breath: Secondary | ICD-10-CM | POA: Insufficient documentation

## 2020-06-06 DIAGNOSIS — E119 Type 2 diabetes mellitus without complications: Secondary | ICD-10-CM | POA: Insufficient documentation

## 2020-06-06 DIAGNOSIS — R0600 Dyspnea, unspecified: Secondary | ICD-10-CM | POA: Insufficient documentation

## 2020-06-06 DIAGNOSIS — Z7984 Long term (current) use of oral hypoglycemic drugs: Secondary | ICD-10-CM | POA: Insufficient documentation

## 2020-06-06 DIAGNOSIS — R059 Cough, unspecified: Secondary | ICD-10-CM | POA: Insufficient documentation

## 2020-06-06 LAB — CBC WITH DIFFERENTIAL/PLATELET
Abs Immature Granulocytes: 0.01 10*3/uL (ref 0.00–0.07)
Basophils Absolute: 0 10*3/uL (ref 0.0–0.1)
Basophils Relative: 1 %
Eosinophils Absolute: 0.1 10*3/uL (ref 0.0–0.5)
Eosinophils Relative: 2 %
HCT: 42.5 % (ref 36.0–46.0)
Hemoglobin: 13.3 g/dL (ref 12.0–15.0)
Immature Granulocytes: 0 %
Lymphocytes Relative: 45 %
Lymphs Abs: 2.7 10*3/uL (ref 0.7–4.0)
MCH: 26.9 pg (ref 26.0–34.0)
MCHC: 31.3 g/dL (ref 30.0–36.0)
MCV: 85.9 fL (ref 80.0–100.0)
Monocytes Absolute: 0.5 10*3/uL (ref 0.1–1.0)
Monocytes Relative: 7 %
Neutro Abs: 2.8 10*3/uL (ref 1.7–7.7)
Neutrophils Relative %: 45 %
Platelets: 224 10*3/uL (ref 150–400)
RBC: 4.95 MIL/uL (ref 3.87–5.11)
RDW: 13.2 % (ref 11.5–15.5)
WBC: 6.1 10*3/uL (ref 4.0–10.5)
nRBC: 0 % (ref 0.0–0.2)

## 2020-06-06 LAB — BRAIN NATRIURETIC PEPTIDE: B Natriuretic Peptide: 21 pg/mL (ref 0.0–100.0)

## 2020-06-06 LAB — COMPREHENSIVE METABOLIC PANEL
ALT: 17 U/L (ref 0–44)
AST: 17 U/L (ref 15–41)
Albumin: 4 g/dL (ref 3.5–5.0)
Alkaline Phosphatase: 73 U/L (ref 38–126)
Anion gap: 10 (ref 5–15)
BUN: 12 mg/dL (ref 6–20)
CO2: 27 mmol/L (ref 22–32)
Calcium: 9.5 mg/dL (ref 8.9–10.3)
Chloride: 101 mmol/L (ref 98–111)
Creatinine, Ser: 0.64 mg/dL (ref 0.44–1.00)
GFR, Estimated: >60 mL/min (ref >60)
Glucose, Bld: 150 mg/dL — ABNORMAL HIGH (ref 70–99)
Potassium: 3.3 mmol/L — ABNORMAL LOW (ref 3.5–5.1)
Sodium: 138 mmol/L (ref 135–145)
Total Bilirubin: 0.4 mg/dL (ref 0.3–1.2)
Total Protein: 8.2 g/dL — ABNORMAL HIGH (ref 6.5–8.1)

## 2020-06-06 LAB — TROPONIN I (HIGH SENSITIVITY): Troponin I (High Sensitivity): 3 ng/L (ref <18)

## 2020-06-06 LAB — CBG MONITORING, ED: Glucose-Capillary: 136 mg/dL — ABNORMAL HIGH (ref 70–99)

## 2020-06-06 MED ORDER — METFORMIN HCL 500 MG PO TABS: 500.0000 mg | ORAL_TABLET | Freq: Every day | ORAL | 0 refills | Status: AC

## 2020-06-06 MED ORDER — POTASSIUM CHLORIDE CRYS ER 20 MEQ PO TBCR
40.0000 meq | EXTENDED_RELEASE_TABLET | Freq: Once | ORAL | Status: AC
  Administered 2020-06-06: 40 meq via ORAL
  Filled 2020-06-06: qty 2

## 2020-06-06 MED ORDER — PREDNISONE 50 MG PO TABS: 50.0000 mg | ORAL_TABLET | Freq: Every day | ORAL | 0 refills | Status: AC

## 2020-06-06 MED ORDER — PREDNISONE 20 MG PO TABS
60.0000 mg | ORAL_TABLET | Freq: Once | ORAL | Status: AC
  Administered 2020-06-06: 60 mg via ORAL
  Filled 2020-06-06: qty 3

## 2020-06-06 MED ORDER — POTASSIUM CHLORIDE CRYS ER 20 MEQ PO TBCR: 20.0000 meq | EXTENDED_RELEASE_TABLET | Freq: Two times a day (BID) | ORAL | 0 refills | Status: AC

## 2020-06-06 MED ORDER — ALBUTEROL SULFATE HFA 108 (90 BASE) MCG/ACT IN AERS: 2.0000 | INHALATION_SPRAY | RESPIRATORY_TRACT | 0 refills | Status: AC | PRN

## 2020-06-06 MED ORDER — ALBUTEROL SULFATE HFA 108 (90 BASE) MCG/ACT IN AERS
2.0000 | INHALATION_SPRAY | Freq: Once | RESPIRATORY_TRACT | Status: AC
  Administered 2020-06-06: 2 via RESPIRATORY_TRACT
  Filled 2020-06-06: qty 6.7

## 2020-06-06 NOTE — ED Triage Notes (Signed)
Pt c/o cough x 1 yr as well as acid reflux, states it has gotten worse over the last several months. Has tried OTC meds without relief. Pt also states she is out of her metformin

## 2020-06-06 NOTE — ED Provider Notes (Signed)
[facility] Provider Note   CSN: [id number] Arrival date & time: 06/06/20  0210     History Chief Complaint  Patient presents with  . Cough  . Heartburn    Belinda Stewart is a 50 y.o. female.  The history is provided by the patient.  Cough Heartburn  She has history of diabetes and comes in because of cough and dyspnea.  Cough has been present for about 8 months and is productive of clear to yellow sputum.  Cough tends to be worse at night.  She denies fever or chills but has had some night sweats.  She also does admit to some involuntary weight loss.  She has also noted some exertional dyspnea.  She currently gets short of breath going up a flight of steps or walking more than about one half of a city block at.  She does have some chest pain, but primarily related to coughing.  She denies nausea or vomiting.  She is a non-smoker, but does have a history of passive smoke exposure.  She was placed on pantoprazole because it was suspected that her cough was related to acid reflux, but cough did not improve after being placed on pantoprazole.  Has a secondary complaint, she ran out of her metformin for her diabetes about 2 weeks ago.  Glucose at home has been as high as 192.   Past Medical History:  Diagnosis Date  . Anemia   . Diabetes mellitus   . Gallstones     Patient Active Problem List   Diagnosis Date Noted  . OBESITY 08/13/2007  . COUGH 08/13/2007  . WEIGHT GAIN 07/05/2007  . DIABETES MELLITUS 01/14/1995    Past Surgical History:  Procedure Laterality Date  . CESAREAN SECTION     x 2  . CHOLECYSTECTOMY  08/23/2011   Procedure: LAPAROSCOPIC CHOLECYSTECTOMY WITH INTRAOPERATIVE CHOLANGIOGRAM;  Surgeon: Adolph Pollack, MD;  Location: [facility];  Service: General;  Laterality: N/A;  . URETHRAL DIVERTICULUM REPAIR       OB History    Gravida  3   Para  3   Term      Preterm      AB      Living  3     SAB      IAB       Ectopic      Multiple      Live Births              Family History  Problem Relation Age of Onset  . Cancer Maternal Aunt        breast  . Diabetes Maternal Grandmother   . Other Neg Hx     Social History   Tobacco Use  . Smoking status: Never Smoker  . Smokeless tobacco: Never Used  Substance Use Topics  . Alcohol use: No  . Drug use: No    Home Medications Prior to Admission medications   Medication Sig Start Date End Date Taking? Authorizing Provider  Atorvastatin Calcium (LIPITOR PO) Take by mouth.    [provider]  cephALEXin (KEFLEX) 500 MG capsule Take 1 capsule (500 mg total) by mouth 3 (three) times daily. 01/27/15   Pisciotta, Joni Reining, PA-C  IRON PO Take by mouth.    [provider]  metFORMIN (GLUCOPHAGE) 500 MG tablet Take 500 mg by mouth daily with breakfast.    [provider]  oxyCODONE-acetaminophen (PERCOCET/ROXICET) 5-325 MG tablet 1 to 2 tabs PO q6hrs  PRN for pain 01/27/15   Pisciotta, Joni Reining, PA-C    Allergies    Bee venom, Sulfamethoxazole-trimethoprim, and Sulfonamide derivatives  Review of Systems   Review of Systems  Respiratory: Positive for cough.   Gastrointestinal: Positive for heartburn.  All other systems reviewed and are negative.   Physical Exam Updated Vital Signs BP (!) 153/113   Pulse 78   Temp 97.9 F (36.6 C) (Oral)   Resp 16   Ht 5\' 9"  (1.753 m)   Wt 94.8 kg   LMP 05/14/2019 (Approximate)   SpO2 100%   BMI 30.86 kg/m   Physical Exam Vitals and nursing note reviewed.   50 year old female, resting comfortably and in no acute distress. Vital signs are significant for elevated blood pressure. Oxygen saturation is 100%, which is normal. Head is normocephalic and atraumatic. PERRLA, EOMI. Oropharynx is clear. Neck is nontender and supple without adenopathy or JVD. Back is nontender and there is no CVA tenderness. Lungs are clear without rales, wheezes, or rhonchi.  Exhalation phase is  slightly prolonged. Chest is nontender. Heart has regular rate and rhythm without murmur. Abdomen is soft, flat, nontender without masses or hepatosplenomegaly and peristalsis is normoactive. Extremities have trace edema, full range of motion is present. Skin is warm and dry without rash. Neurologic: Mental status is normal, cranial nerves are intact, there are no motor or sensory deficits.  ED Results / Procedures / Treatments   Labs (all labs ordered are listed, but only abnormal results are displayed) Labs Reviewed  COMPREHENSIVE METABOLIC PANEL - Abnormal; Notable for the following components:      Result Value   Potassium 3.3 (*)    Glucose, Bld 150 (*)    Total Protein 8.2 (*)    All other components within normal limits  CBG MONITORING, ED - Abnormal; Notable for the following components:   Glucose-Capillary 136 (*)    All other components within normal limits  BRAIN NATRIURETIC PEPTIDE  CBC WITH DIFFERENTIAL/PLATELET  TROPONIN I (HIGH SENSITIVITY)  TROPONIN I (HIGH SENSITIVITY)    EKG ED ECG REPORT   Date: 06/06/2020  Rate: 69  Rhythm: normal sinus rhythm  QRS Axis: normal  Intervals: PR prolonged  ST/T Wave abnormalities: normal  Conduction Disutrbances:none  Narrative Interpretation: First-degree AV block, otherwise normal ECG.  When compared with ECG of 05/03/2014, no significant changes are seen.  Old EKG Reviewed: unchanged  I have personally reviewed the EKG tracing and agree with the computerized printout as noted.  Radiology DG Chest 2 View  Result Date: 06/06/2020 CLINICAL DATA:  Cough. Cough for over 8 months worse tonight. Acid reflux. EXAM: CHEST - 2 VIEW COMPARISON:  Chest x-ray 05/03/14 FINDINGS: The heart size and mediastinal contours are within normal limits. No focal consolidation. No pulmonary edema. No pleural effusion. No pneumothorax. No acute osseous abnormality.  Right upper quadrant surgical clips. IMPRESSION: No active cardiopulmonary  disease. Electronically Signed   By: 05/05/14 M.D.   On: 06/06/2020 03:49    Procedures Procedures   Medications Ordered in ED Medications  potassium chloride SA (KLOR-CON) CR tablet 40 mEq (has no administration in time range)  predniSONE (DELTASONE) tablet 60 mg (has no administration in time range)  albuterol (VENTOLIN HFA) 108 (90 Base) MCG/ACT inhaler 2 puff (2 puffs Inhalation Given 06/06/20 0402)    ED Course  I have reviewed the triage vital signs and the nursing notes.  Pertinent labs & imaging results that were available during my care of the  patient were reviewed by me and considered in my medical decision making (see chart for details).   MDM Rules/Calculators/A&P                         Cough of uncertain cause.  I suspect this is from bronchospasm, and she will be given a therapeutic trial of albuterol.  We will also check chest x-ray, ECG, BNP.  Old records are reviewed, and she has not seen her primary care provider since last October, has not had a chest x-ray since 2016.  Weight loss from that office visit in October until today is 2.4 kg.  Chest x-ray is normal.  ECG is normal except for minimal first-degree AV block.  CBC is normal.  Blood glucose is only mildly elevated at 150.  Remainder of metabolic panel is significant for mild hypokalemia, and she is given a dose of oral potassium.  BNP is normal as is troponin.  With symptoms present for months, I do not feel there is a need for a second troponin.  Patient had significant symptomatic relief with albuterol.  At this point, symptoms seem to be related to bronchospasm.  She is given a dose of prednisone and is discharged with prescriptions for prednisone and albuterol HFA.  Also given prescription for K-Dur.  Follow-up with PCP in 2 weeks.  Depending on clinical response, she may still benefit from referral to pulmonology.  Final Clinical Impression(s) / ED Diagnoses Final diagnoses:  Cough  Shortness of breath   Hypokalemia    Rx / DC Orders ED Discharge Orders         Ordered    predniSONE (DELTASONE) 50 MG tablet  Daily        06/06/20 0523    albuterol (VENTOLIN HFA) 108 (90 Base) MCG/ACT inhaler  Every 4 hours PRN        06/06/20 0523    potassium chloride SA (KLOR-CON) 20 MEQ tablet  2 times daily        06/06/20 0523    metFORMIN (GLUCOPHAGE) 500 MG tablet  Daily with breakfast        06/06/20 0528           Dione Booze, MD 06/06/20 0530

## 2020-06-06 NOTE — Discharge Instructions (Addendum)
Your evaluation did not show any signs of heart failure.  Also, I doubt that she would have significant acid reflux as a cause of your cough.  Please continue to use the inhaler, 2 puffs every 4 hours, as needed to control cough and to help with your shortness of breath.

## 2020-11-01 ENCOUNTER — Other Ambulatory Visit: Payer: Self-pay | Admitting: Endocrinology

## 2020-11-01 DIAGNOSIS — Z1231 Encounter for screening mammogram for malignant neoplasm of breast: Secondary | ICD-10-CM

## 2020-11-14 ENCOUNTER — Other Ambulatory Visit: Payer: Self-pay | Admitting: Endocrinology

## 2020-11-17 ENCOUNTER — Ambulatory Visit: Payer: Self-pay

## 2021-09-04 ENCOUNTER — Other Ambulatory Visit: Payer: Self-pay | Admitting: Orthopaedic Surgery

## 2021-09-04 DIAGNOSIS — M5412 Radiculopathy, cervical region: Secondary | ICD-10-CM

## 2021-09-20 ENCOUNTER — Ambulatory Visit
Admission: RE | Admit: 2021-09-20 | Discharge: 2021-09-20 | Disposition: A | Discharge status: Home / Self Care | Payer: Self-pay | Source: Ambulatory Visit | Attending: Orthopaedic Surgery | Admitting: Orthopaedic Surgery

## 2021-09-20 DIAGNOSIS — M5412 Radiculopathy, cervical region: Secondary | ICD-10-CM
